# Patient Record
Sex: Female | Born: 1960
Health system: Southern US, Community
[De-identification: ages and names within clinical notes are randomized; demographics above are authoritative.]

## PROBLEM LIST (undated history)

## (undated) DIAGNOSIS — J4530 Mild persistent asthma, uncomplicated: Secondary | ICD-10-CM

## (undated) DIAGNOSIS — K219 Gastro-esophageal reflux disease without esophagitis: Secondary | ICD-10-CM

## (undated) DIAGNOSIS — N393 Stress incontinence (female) (male): Secondary | ICD-10-CM

## (undated) DIAGNOSIS — E611 Iron deficiency: Secondary | ICD-10-CM

## (undated) DIAGNOSIS — Z973 Presence of spectacles and contact lenses: Secondary | ICD-10-CM

## (undated) DIAGNOSIS — N3010 Interstitial cystitis (chronic) without hematuria: Secondary | ICD-10-CM

## (undated) DIAGNOSIS — M1990 Unspecified osteoarthritis, unspecified site: Secondary | ICD-10-CM

## (undated) HISTORY — PX: CYSTOSCOPY: SHX5120

## (undated) HISTORY — DX: Interstitial cystitis (chronic) without hematuria: N30.10

## (undated) HISTORY — PX: CYSTOSCOPY WITH HYDRODISTENSION AND BIOPSY: SHX5127

## (undated) HISTORY — PX: BLADDER SURGERY: SHX569

## (undated) HISTORY — PX: OTHER SURGICAL HISTORY: SHX169

---

## 1982-04-09 HISTORY — PX: POSTERIOR FUSION CERVICAL SPINE: SUR628

## 1998-04-19 ENCOUNTER — Other Ambulatory Visit: Admission: RE | Admit: 1998-04-19 | Discharge: 1998-04-19 | Payer: Self-pay | Admitting: Obstetrics & Gynecology

## 1998-10-07 ENCOUNTER — Ambulatory Visit (HOSPITAL_COMMUNITY): Admission: RE | Admit: 1998-10-07 | Discharge: 1998-10-07 | Payer: Self-pay | Admitting: Obstetrics & Gynecology

## 1999-09-05 ENCOUNTER — Other Ambulatory Visit: Admission: RE | Admit: 1999-09-05 | Discharge: 1999-09-05 | Payer: Self-pay | Admitting: Obstetrics & Gynecology

## 2000-08-28 ENCOUNTER — Other Ambulatory Visit: Admission: RE | Admit: 2000-08-28 | Discharge: 2000-08-28 | Payer: Self-pay | Admitting: Obstetrics & Gynecology

## 2001-08-29 ENCOUNTER — Other Ambulatory Visit: Admission: RE | Admit: 2001-08-29 | Discharge: 2001-08-29 | Payer: Self-pay | Admitting: Obstetrics & Gynecology

## 2003-01-18 ENCOUNTER — Encounter: Payer: Self-pay | Admitting: Emergency Medicine

## 2003-01-18 ENCOUNTER — Emergency Department (HOSPITAL_COMMUNITY): Admission: EM | Admit: 2003-01-18 | Discharge: 2003-01-18 | Payer: Self-pay | Admitting: Emergency Medicine

## 2003-01-25 ENCOUNTER — Other Ambulatory Visit: Admission: RE | Admit: 2003-01-25 | Discharge: 2003-01-25 | Payer: Self-pay | Admitting: Obstetrics & Gynecology

## 2004-07-27 ENCOUNTER — Other Ambulatory Visit: Admission: RE | Admit: 2004-07-27 | Discharge: 2004-07-27 | Payer: Self-pay | Admitting: Obstetrics & Gynecology

## 2009-01-03 ENCOUNTER — Encounter: Admission: RE | Admit: 2009-01-03 | Discharge: 2009-01-03 | Payer: Self-pay | Admitting: Obstetrics & Gynecology

## 2010-05-01 ENCOUNTER — Encounter: Payer: Self-pay | Admitting: Obstetrics & Gynecology

## 2010-06-09 IMAGING — MG MM DIAGNOSTIC UNILATERAL L
2 series · 2 of 2 positions shown · non-contrast
Comparison: 06/27/2007 and 03/15/2006

CLINICAL DATA: Patient presents for additional views of the left
breast as a follow up to recent screening mammogram from 12/27/2008
demonstrating possible mass in the deep third of the mid to lower
left breast.

DIGITAL DIAGNOSTIC LEFT MAMMOGRAM

[L MLO]
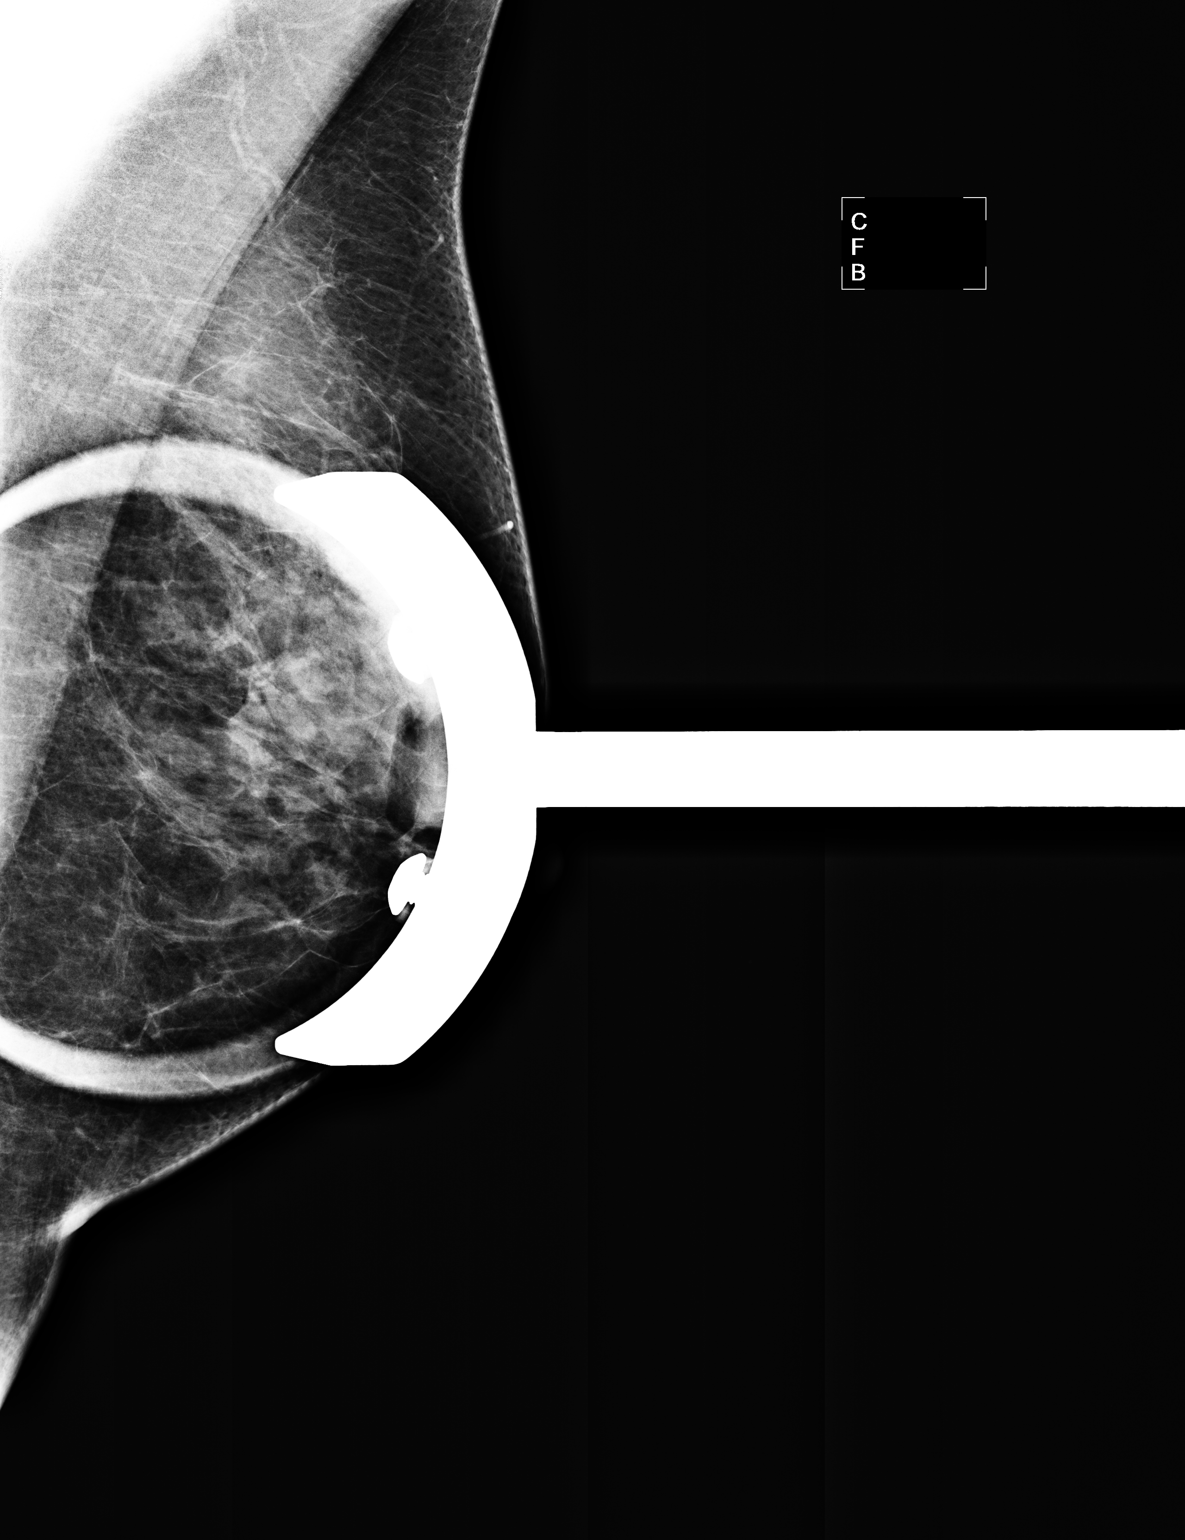

[L ML]
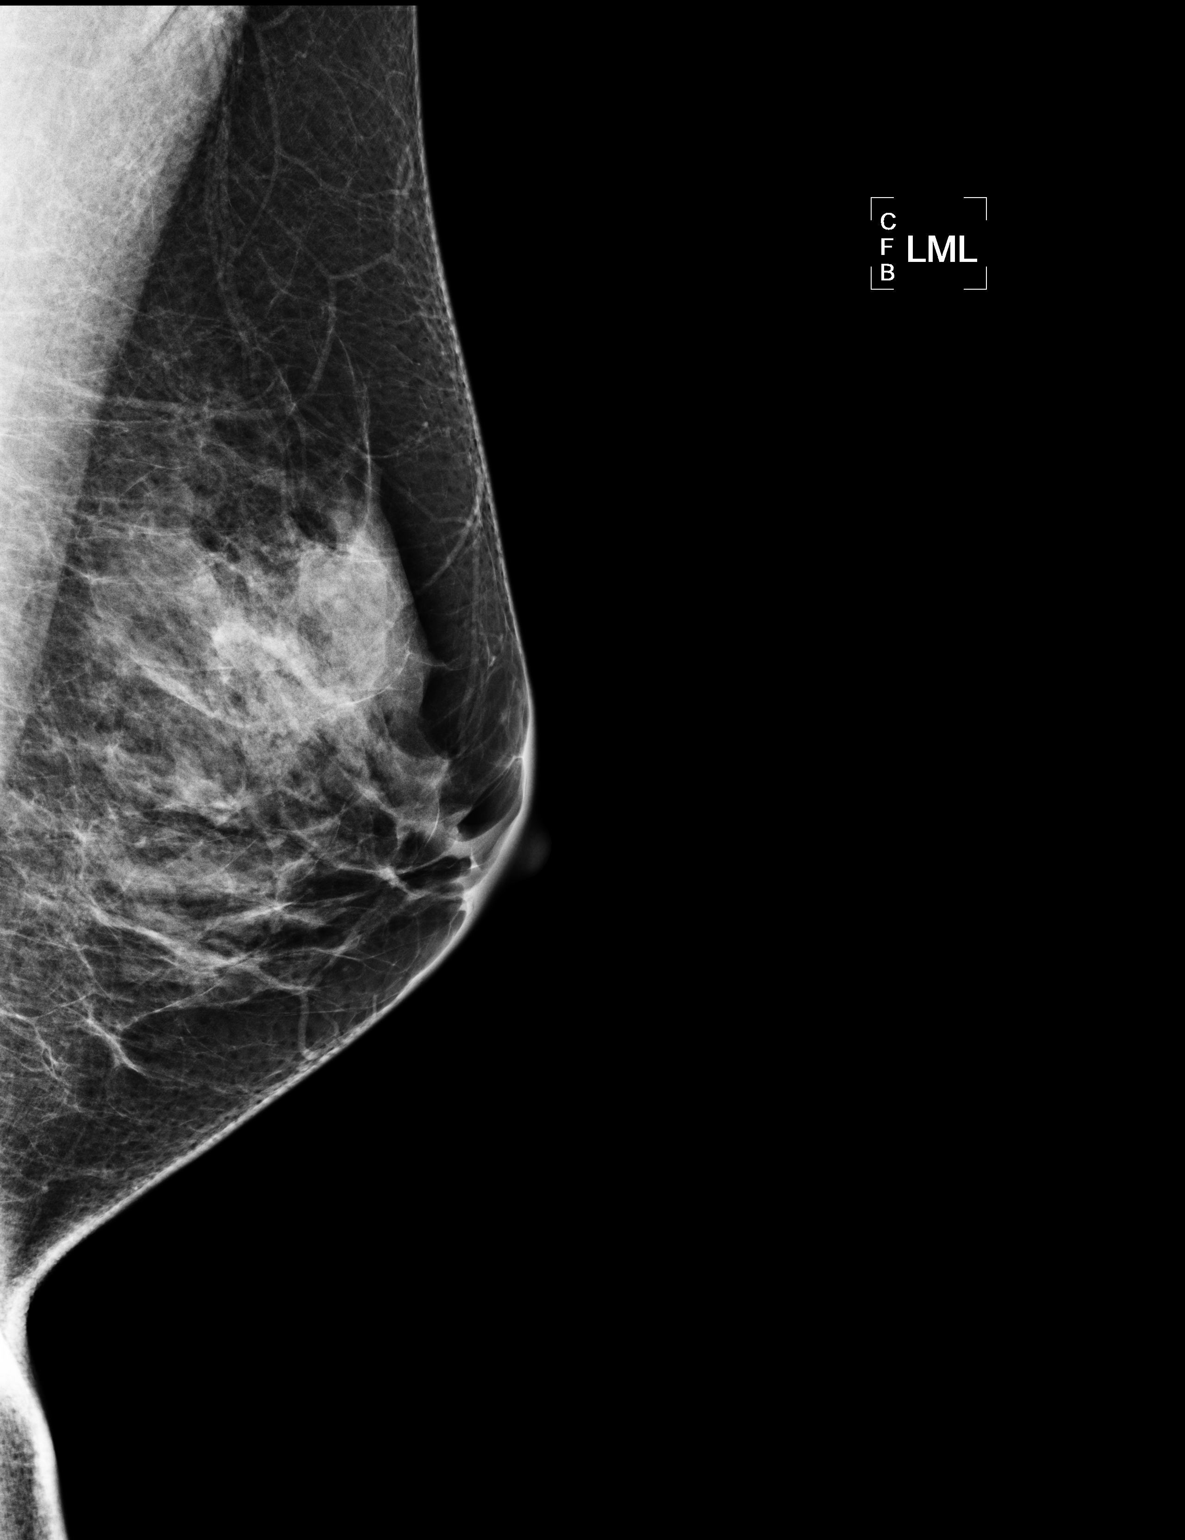

[2 of 2 positions shown; findings below may reference images not displayed]

FINDINGS: Spot compression MLO and true lateral images fail to
demonstrate a focal abnormality in the deep third of the mid to
lower left breast.
IMPRESSION: No focal abnormality over the deep third of the left mid to lower
breast.

Recommendations:  Recommend continued annual bilateral screening
mammographic followup.

BI-RADS CATEGORY 1:  Negative.

## 2010-06-21 ENCOUNTER — Institutional Professional Consult (permissible substitution) (INDEPENDENT_AMBULATORY_CARE_PROVIDER_SITE_OTHER): Payer: BC Managed Care – PPO | Admitting: Internal Medicine

## 2010-06-21 ENCOUNTER — Ambulatory Visit (INDEPENDENT_AMBULATORY_CARE_PROVIDER_SITE_OTHER)
Admission: RE | Admit: 2010-06-21 | Discharge: 2010-06-21 | Disposition: A | Discharge status: Home / Self Care | Payer: BC Managed Care – PPO | Source: Ambulatory Visit | Attending: Internal Medicine | Admitting: Internal Medicine

## 2010-06-21 ENCOUNTER — Other Ambulatory Visit: Payer: Self-pay | Admitting: Internal Medicine

## 2010-06-21 ENCOUNTER — Encounter: Payer: Self-pay | Admitting: Internal Medicine

## 2010-06-21 DIAGNOSIS — J45909 Unspecified asthma, uncomplicated: Secondary | ICD-10-CM

## 2010-06-21 DIAGNOSIS — J309 Allergic rhinitis, unspecified: Secondary | ICD-10-CM

## 2010-06-22 ENCOUNTER — Encounter: Payer: Self-pay | Admitting: Internal Medicine

## 2010-06-27 NOTE — Assessment & Plan Note (Signed)
Summary: asthma- self refer- pt saw dr Kamil Mchaffie previously for same ///kp   Primary Provider/Referring Provider:  Donette Larry  CC:  Asthma consult-former pt at [facility] chest-using Advair more than usual..  History of Present Illness: June 21, 2010- 50 yoF followed by Dr Donette Larry for primary care and self referred now, asking  pulmonary status update. I had seen her in 1990 and 1997 for asthma and allergic rhinitis. FEV1 then was around 81-88% and a methacholine challenge was positive at 50 units. Asthma onset in early 30's. Smoked 1 PPD for 12 years ending age 17. In last 1-2 years, seasonal rhinits more perennial, less well defined and asthma has followed same pattern. Fall usually worse than Spring. Triggers- Fall season, close exposure to her indoor dog, colds with sinus drainage, irritant odors and smoke. Helps if she wears mask to dust or rake leaves. Usually some morning cough with clear mucus. Denies reflux and breathing doesn't wake her.  House, no basement or mold, carpet, 1 dog, no smokers.  Uses Advair few days at a time for acute flares- usually sufficient. Occasional Ventolin rescue during flares and before exercise. Last prednisone several years ago, no ER or Hosp for breathing.  Has not wanted pneumovax or flu vax  Asthma History    Initial Asthma Severity Rating:    Age range: 12+ years    Symptoms: 0-2 days/week    Nighttime Awakenings: 0-2/month    Interferes w/ normal activity: no limitations    SABA use (not for EIB): 0-2 days/week    Asthma Severity Assessment: Intermittent   Preventive Screening-Counseling & Management  Alcohol-Tobacco     Smoking Status: quit     Packs/Day: 1.0     Year Started: age 20     Year Quit: 03-09-1986  Current Medications (verified): 1)  Advair Diskus 100-50 Mcg/dose Aepb (Fluticasone-Salmeterol) .... Take 1 Two Times A Day As Needed 2)  Ferrous Sulfate 325 (65 Fe) Mg Tabs (Ferrous Sulfate) .... Take 1 By Mouth Once Daily  Allergies  (verified): 1)  ! Macrobid  Past History:  Past Medical History: Asthma Allergic rhinitis Hx interstital cystitis  Past Surgical History: C-spine fusion for neck fx cystoscopy  Family History: Family hx: breast cancer, allergies, heart disease. 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  The patient denies shortness of breath at rest, productive cough, coughing up blood, chest pain, irregular heartbeats, acid heartburn, indigestion, loss of appetite, weight change, abdominal pain, difficulty swallowing, sore throat, tooth/dental problems, headaches, sneezing, itching, ear ache, anxiety, depression, hand/feet swelling, joint stiffness or pain, rash, change in color of mucus, and fever.    Vital Signs:  Patient profile:   50 year old female Height:      64 inches Weight:      149.25 pounds BMI:     25.71 O2 Sat:      99 % on Room air Pulse rate:   55 / minute BP sitting:   110 / 72  (left arm) Cuff size:   regular  Vitals Entered By: Reynaldo Minium CMA (June 21, 2010 9:12 AM)  O2 Flow:  Room air CC: Asthma consult-former pt at [facility] chest-using Advair more than usual.  Physical Exam  Additional Exam:  General: A/Ox3; pleasant and cooperative, NAD, wdwn SKIN: no rash, lesions NODES: no lymphadenopathy HEENT: [place]/AT, EOM- WNL, Conjuctivae- clear, PERRLA, TM-WNL, Nose- clear, Throat- clear and wnl, Mallampati  II NECK: Supple w/ fair ROM, JVD- none, normal carotid impulses w/o bruits Thyroid- normal to palpation CHEST: Clear to P&A, no cough or wheeze HEART: RRR, no m/g/r heard ABDOMEN: Soft and nl; nml bowel sounds; no organomegaly or masses  noted ZOX:WRUE, nl pulses, no edema  NEURO: Grossly intact to observation      Impression & Recommendations:  Problem # 1:  ASTHMA, MILD (ICD-493.90) Mild intermittent and in past mostly a seasonal pattern. Maybe a little more persistent in past few years. We discussed her meds and will increase her Advair to try maintenance once daily for next month, with rescue if needed or before exercise.  Will get PFT and CXR. She describes a morning cough which not progressive r intrusive. it suggests a chronic bronchitis pattern.   Problem # 2:  ALLERGIC RHINITIS (ICD-477.9) Allergy Skin Testing 1995- Pos for some weed, tree, mold and cat. mild, seasonal and managed ok with otc antihistamines.  . .   Medications Added to Medication List This Visit: 1)  Advair Diskus 100-50 Mcg/dose Aepb (Fluticasone-salmeterol) .... Take 1 two times a day as needed 2)  Ferrous Sulfate 325 (65 Fe) Mg Tabs (Ferrous sulfate) .... Take 1 by mouth once daily  Other Orders: New Patient Level IV (45409) T-2 View CXR (71020TC)  Patient Instructions: 1)  Please schedule a follow-up appointment in 1 month. Please call sooner as needed.  2)  A chest x-ray has been recommended.  Your imaging study may require preauthorization.  3)  Schedule PFT 4)  a1AT test 5)  I suggest that for the next month, you try using your maintenance Advair once every day, then rinse mouth. 6)  Continue with Ventolin rescue inhaler as you have been.

## 2010-06-27 NOTE — Letter (Signed)
Summary: [place] [facility] Results Follow Up Letter  [facility]  [address]   [place], [place] [address]   Phone: [phone]  Fax: [phone]    06/22/2010 MRN: [id number]  Cindy Decker [address] [place], [place]  [address]  [place]  Dear Ms. SAMPSEL,  We have received the results from your recent tests and have been unable to contact you.  Please call our office at [phone] so that Dr.Young or his nurse may review the results with you.    Thank you,     Nature conservation officer Pulmonary Division

## 2010-06-28 ENCOUNTER — Telehealth: Payer: Self-pay | Admitting: Internal Medicine

## 2010-06-28 NOTE — Telephone Encounter (Signed)
Katie mailed letter to pt because she attempted to call her regarding CXR results however home # was a fax #.    Called, spoke with pt.  She was informed per append in EMR from 06/21/10 by Dr. Maple Hudson: CXR- There is some scarring in the upper parts of both lungs, but no acute findings. We will discuss on return. Pt verbalized understanding of this.  Pt also states her home number is the same as her fax number.  Best number to reach her on is cell:  [phone].  Number added to pt's demographics.

## 2010-07-12 ENCOUNTER — Encounter: Payer: Self-pay | Admitting: Internal Medicine

## 2010-07-17 NOTE — Progress Notes (Signed)
Quick Note:  LMTCB ______ 

## 2010-08-09 ENCOUNTER — Encounter: Payer: Self-pay | Admitting: Internal Medicine

## 2010-08-11 ENCOUNTER — Ambulatory Visit (INDEPENDENT_AMBULATORY_CARE_PROVIDER_SITE_OTHER): Payer: BC Managed Care – PPO | Admitting: Internal Medicine

## 2010-08-11 ENCOUNTER — Encounter: Payer: Self-pay | Admitting: Internal Medicine

## 2010-08-11 VITALS — BP 112/58 | HR 51 | Ht 64.0 in | Wt 145.0 lb

## 2010-08-11 DIAGNOSIS — J45909 Unspecified asthma, uncomplicated: Secondary | ICD-10-CM

## 2010-08-11 LAB — PULMONARY FUNCTION TEST

## 2010-08-11 MED ORDER — FLUTICASONE-SALMETEROL 100-50 MCG/DOSE IN AEPB: 1.0000 | INHALATION_SPRAY | Freq: Two times a day (BID) | RESPIRATORY_TRACT | Status: DC

## 2010-08-11 MED ORDER — ALBUTEROL SULFATE HFA 108 (90 BASE) MCG/ACT IN AERS: 2.0000 | INHALATION_SPRAY | Freq: Four times a day (QID) | RESPIRATORY_TRACT | Status: DC | PRN

## 2010-08-11 NOTE — Progress Notes (Signed)
  Subjective:    Patient ID: Cindy Decker, female    DOB: 12-01-1960, 50 y.o.   MRN: [id number]  HPI 08/11/10- 50 yoF former smoker followed for chronic obstructive asthma/ minimal COPD, allergic rhinitis.  Last here March 14 for initial visit. No significant changes since that visit- note reviewed.  She has taken some antihistamine but pollen season hasn't been bad. PFT- 08/10/09- FEV1 2.29/93%, FEV1/FVC 0.66, FEF 25-75 1.38/ 49% w/ response to BD  DLCO 98% a1AT- M/S functionally normal variant CXR - 06/21/10- apical scarring, NAD.   Review of Systems Constitutional:   No weight loss, night sweats,  Fevers, chills, fatigue, lassitude. HEENT:   No headaches,  Difficulty swallowing,  Tooth/dental problems,  Sore throat,                No sneezing, itching, ear ache, nasal congestion, post nasal drip,   CV:  No chest pain,  Orthopnea, PND, swelling in lower extremities, anasarca, dizziness, palpitations  GI  No heartburn, indigestion, abdominal pain, nausea, vomiting, diarrhea, change in bowel habits, loss of appetite  Resp: No shortness of breath with exertion or at rest.  No excess mucus, no productive cough,  No non-productive cough,  No coughing up of blood.  No change in color of mucus.  No wheezing.   Skin: no rash or lesions.  GU: no dysuria, change in color of urine, no urgency or frequency.  No flank pain.  MS:  No joint pain or swelling.  No decreased range of motion.  No back pain.  Psych:  No change in mood or affect. No depression or anxiety.  No memory loss.       Objective:   Physical Exam    General- Alert, Oriented, Affect-appropriate, Distress- none acute  Skin- rash-none, lesions- none, excoriation- none  Lymphadenopathy- none  Head- atraumatic  Eyes- Gross vision intact, PERRLA, conjunctivae clear, secretions  Ears- Hearing, canals, Tm L ,   R ,-  normal  Nose- Clear, No-Septal dev, mucus, polyps, erosion, perforation   Throat- Mallampati II , mucosa  clear , drainage- none, tonsils- atrophic  Neck- flexible , trachea midline, no stridor , thyroid nl, carotid no bruit  Chest - symmetrical excursion , unlabored     Heart/CV- RRR , no murmur , no gallop  , no rub, nl s1 s2                     - JVD- none , edema- none, stasis changes- none, varices- none     Lung- clear to P&A, wheeze- none, cough- none , dullness-none, rub- none     Chest wall-  Abd- tender-no, distended-no, bowel sounds-present, HSM- no  Br/ Gen/ Rectal- Not done, not indicated  Extrem- cyanosis- none, clubbing, none, atrophy- none, strength- nl  Neuro- grossly intact to observation      Assessment & Plan:

## 2010-08-11 NOTE — Patient Instructions (Addendum)
Continue Advair 100/50, 1 puff once daily. If trouble starts, move up to twice daily.  Refill scripts for Advair and Ventolin printed  Your alpha 1 antitrypsin genotype' is M-S  ,which is a benign variant.

## 2010-08-11 NOTE — Progress Notes (Signed)
PFT done today. 

## 2010-08-11 NOTE — Assessment & Plan Note (Signed)
Mild chronic obstructive asthma. Will continue Advair once daily aeith discusion. Encouraged aerobic exercise. Her M-S variant alpha does not carry increased risk.

## 2010-08-20 ENCOUNTER — Encounter: Payer: Self-pay | Admitting: Internal Medicine

## 2010-08-29 ENCOUNTER — Encounter: Payer: Self-pay | Admitting: Internal Medicine

## 2015-08-18 DIAGNOSIS — G5761 Lesion of plantar nerve, right lower limb: Secondary | ICD-10-CM | POA: Diagnosis not present

## 2015-09-06 DIAGNOSIS — H40013 Open angle with borderline findings, low risk, bilateral: Secondary | ICD-10-CM | POA: Diagnosis not present

## 2015-09-13 DIAGNOSIS — H40033 Anatomical narrow angle, bilateral: Secondary | ICD-10-CM | POA: Diagnosis not present

## 2015-09-13 DIAGNOSIS — H2513 Age-related nuclear cataract, bilateral: Secondary | ICD-10-CM | POA: Diagnosis not present

## 2015-09-13 DIAGNOSIS — H43812 Vitreous degeneration, left eye: Secondary | ICD-10-CM | POA: Diagnosis not present

## 2015-10-10 DIAGNOSIS — H25013 Cortical age-related cataract, bilateral: Secondary | ICD-10-CM | POA: Diagnosis not present

## 2015-10-10 DIAGNOSIS — H40033 Anatomical narrow angle, bilateral: Secondary | ICD-10-CM | POA: Diagnosis not present

## 2015-10-10 DIAGNOSIS — H04123 Dry eye syndrome of bilateral lacrimal glands: Secondary | ICD-10-CM | POA: Diagnosis not present

## 2015-10-10 DIAGNOSIS — H2513 Age-related nuclear cataract, bilateral: Secondary | ICD-10-CM | POA: Diagnosis not present

## 2015-12-19 DIAGNOSIS — H40033 Anatomical narrow angle, bilateral: Secondary | ICD-10-CM | POA: Diagnosis not present

## 2015-12-19 DIAGNOSIS — H43812 Vitreous degeneration, left eye: Secondary | ICD-10-CM | POA: Diagnosis not present

## 2015-12-19 DIAGNOSIS — H2513 Age-related nuclear cataract, bilateral: Secondary | ICD-10-CM | POA: Diagnosis not present

## 2016-01-02 DIAGNOSIS — Z0000 Encounter for general adult medical examination without abnormal findings: Secondary | ICD-10-CM | POA: Diagnosis not present

## 2016-01-02 DIAGNOSIS — J309 Allergic rhinitis, unspecified: Secondary | ICD-10-CM | POA: Diagnosis not present

## 2016-01-02 DIAGNOSIS — Z1159 Encounter for screening for other viral diseases: Secondary | ICD-10-CM | POA: Diagnosis not present

## 2016-03-27 DIAGNOSIS — M722 Plantar fascial fibromatosis: Secondary | ICD-10-CM | POA: Diagnosis not present

## 2016-05-31 DIAGNOSIS — M722 Plantar fascial fibromatosis: Secondary | ICD-10-CM | POA: Diagnosis not present

## 2016-06-18 DIAGNOSIS — M722 Plantar fascial fibromatosis: Secondary | ICD-10-CM | POA: Diagnosis not present

## 2016-07-02 DIAGNOSIS — Z6826 Body mass index (BMI) 26.0-26.9, adult: Secondary | ICD-10-CM | POA: Diagnosis not present

## 2016-07-02 DIAGNOSIS — Z01419 Encounter for gynecological examination (general) (routine) without abnormal findings: Secondary | ICD-10-CM | POA: Diagnosis not present

## 2016-07-02 DIAGNOSIS — Z1231 Encounter for screening mammogram for malignant neoplasm of breast: Secondary | ICD-10-CM | POA: Diagnosis not present

## 2016-07-13 DIAGNOSIS — J45909 Unspecified asthma, uncomplicated: Secondary | ICD-10-CM | POA: Diagnosis not present

## 2016-07-13 DIAGNOSIS — R05 Cough: Secondary | ICD-10-CM | POA: Diagnosis not present

## 2016-07-13 DIAGNOSIS — J0100 Acute maxillary sinusitis, unspecified: Secondary | ICD-10-CM | POA: Diagnosis not present

## 2016-08-24 DIAGNOSIS — J309 Allergic rhinitis, unspecified: Secondary | ICD-10-CM | POA: Diagnosis not present

## 2016-08-24 DIAGNOSIS — R05 Cough: Secondary | ICD-10-CM | POA: Diagnosis not present

## 2016-08-24 DIAGNOSIS — J45909 Unspecified asthma, uncomplicated: Secondary | ICD-10-CM | POA: Diagnosis not present

## 2017-01-07 DIAGNOSIS — Z1389 Encounter for screening for other disorder: Secondary | ICD-10-CM | POA: Diagnosis not present

## 2017-01-07 DIAGNOSIS — Z136 Encounter for screening for cardiovascular disorders: Secondary | ICD-10-CM | POA: Diagnosis not present

## 2017-01-07 DIAGNOSIS — Z23 Encounter for immunization: Secondary | ICD-10-CM | POA: Diagnosis not present

## 2017-01-07 DIAGNOSIS — Z0000 Encounter for general adult medical examination without abnormal findings: Secondary | ICD-10-CM | POA: Diagnosis not present

## 2017-03-04 DIAGNOSIS — M810 Age-related osteoporosis without current pathological fracture: Secondary | ICD-10-CM | POA: Diagnosis not present

## 2017-03-04 DIAGNOSIS — Z780 Asymptomatic menopausal state: Secondary | ICD-10-CM | POA: Diagnosis not present

## 2017-03-11 DIAGNOSIS — J45909 Unspecified asthma, uncomplicated: Secondary | ICD-10-CM | POA: Diagnosis not present

## 2017-03-11 DIAGNOSIS — M810 Age-related osteoporosis without current pathological fracture: Secondary | ICD-10-CM | POA: Diagnosis not present

## 2017-03-11 DIAGNOSIS — E559 Vitamin D deficiency, unspecified: Secondary | ICD-10-CM | POA: Diagnosis not present

## 2017-07-08 DIAGNOSIS — Z01419 Encounter for gynecological examination (general) (routine) without abnormal findings: Secondary | ICD-10-CM | POA: Diagnosis not present

## 2017-07-08 DIAGNOSIS — Z124 Encounter for screening for malignant neoplasm of cervix: Secondary | ICD-10-CM | POA: Diagnosis not present

## 2017-07-08 DIAGNOSIS — Z1231 Encounter for screening mammogram for malignant neoplasm of breast: Secondary | ICD-10-CM | POA: Diagnosis not present

## 2017-07-08 DIAGNOSIS — Z6826 Body mass index (BMI) 26.0-26.9, adult: Secondary | ICD-10-CM | POA: Diagnosis not present

## 2017-12-19 DIAGNOSIS — M25572 Pain in left ankle and joints of left foot: Secondary | ICD-10-CM | POA: Diagnosis not present

## 2018-01-09 DIAGNOSIS — M79675 Pain in left toe(s): Secondary | ICD-10-CM | POA: Diagnosis not present

## 2018-01-13 DIAGNOSIS — Z0000 Encounter for general adult medical examination without abnormal findings: Secondary | ICD-10-CM | POA: Diagnosis not present

## 2018-01-13 DIAGNOSIS — Z13220 Encounter for screening for lipoid disorders: Secondary | ICD-10-CM | POA: Diagnosis not present

## 2018-01-13 DIAGNOSIS — Z23 Encounter for immunization: Secondary | ICD-10-CM | POA: Diagnosis not present

## 2018-01-13 DIAGNOSIS — Z1389 Encounter for screening for other disorder: Secondary | ICD-10-CM | POA: Diagnosis not present

## 2018-01-28 DIAGNOSIS — C44519 Basal cell carcinoma of skin of other part of trunk: Secondary | ICD-10-CM | POA: Diagnosis not present

## 2018-01-28 DIAGNOSIS — L570 Actinic keratosis: Secondary | ICD-10-CM | POA: Diagnosis not present

## 2018-01-28 DIAGNOSIS — D225 Melanocytic nevi of trunk: Secondary | ICD-10-CM | POA: Diagnosis not present

## 2018-01-28 DIAGNOSIS — X32XXXA Exposure to sunlight, initial encounter: Secondary | ICD-10-CM | POA: Diagnosis not present

## 2018-02-05 ENCOUNTER — Ambulatory Visit: Payer: BLUE CROSS/BLUE SHIELD | Admitting: Internal Medicine

## 2018-02-05 ENCOUNTER — Encounter: Payer: Self-pay | Admitting: Internal Medicine

## 2018-02-05 VITALS — BP 116/70 | HR 69 | Ht 64.0 in | Wt 146.8 lb

## 2018-02-05 DIAGNOSIS — Z87891 Personal history of nicotine dependence: Secondary | ICD-10-CM | POA: Diagnosis not present

## 2018-02-05 DIAGNOSIS — J45909 Unspecified asthma, uncomplicated: Secondary | ICD-10-CM

## 2018-02-05 LAB — NITRIC OXIDE: Nitric Oxide: 10

## 2018-02-05 NOTE — Progress Notes (Signed)
08/11/10- 72 yoF former smoker followed for chronic obstructive asthma/ minimal COPD, allergic rhinitis.  Last here March 14 for initial visit. No significant changes since that visit- note reviewed.  She has taken some antihistamine but pollen season hasn't been bad. PFT- 08/10/09- FEV1 2.29/93%, FEV1/FVC 0.66, FEF 25-75 1.38/ 49% w/ response to BD  DLCO 98% a1AT- M/S functionally normal variant CXR - 06/21/10- apical scarring, NAD.    OV 02/05/2018  Subjective:  Patient ID: Cindy Decker, female , DOB: 11-23-1960 , age 57 y.o. , MRN: [id number] , ADDRESS: [address] [place] [address]   02/05/2018 -   Chief Complaint  Patient presents with  . Consult    seen back in 2012 by CY for asthma, would like more opinions      HPI Cindy Decker 57 y.o. -is a new consult.  Last seen by Dr. Jetty Duhamel in 2012.  Because it is been more than 3 years there is a new evaluation.  Review of the notes from 2012 indicate MS alpha-1 variant.  Asthma was considered mild persistent with above pulmonary function test.  She was then on Advair.  She tells me she has had an asthma diagnosis since age 39 which was 29 years ago.  Since then it has been stable.  For at least 10 or 12 years she is been on Advair mostly taking it intermittently but for the last 2 years she takes it scale on a scheduled basis.  If she does not take her Advair her symptoms do get worse but only by a mild degree.  When she is on Advair she feels better but she continues to have persistent symptoms again at a mild level.  In particular her symptoms are that of cough at a level 3 which is the most significant of symptoms.  And shortness of breath, chest tightness and clearing of the throat at level 2.  Early morning she does bring some clear sputum again rated at level 2 out of 5.  She barely has any wheezing and rated as a level 0-1.  Definitely no orthopnea proximal nocturnal dyspnea.  The cough is particularly worse when she  begins to play racquetball but later as she warms up and starts playing the cough does get better.  For racquetball she has no shortness of breath.  In doing the asthma control questionnaire she says she does not wake up at night because of asthma symptoms when she wakes up she has moderate amount of symptoms and she feels very slightly limited because of asthma and a little shortness of breath and a little of the time she is wheezing but does not use albuterol for rescue.  Her exam nitric oxide today  - is 10 ppb and normal and this is on Advair.  Of note, she does have allergies to dogs and the fall season.  He recollects allergy testing in the 1990s which was positive.  She took allergy shots for a year back then and did not help.  Her mom has COPD  Her biggest concern is that she wants to improve the quality of her cough and the quantity of a cough.  This is because she is been diagnosed with osteoporosis and she is worried about rib fractures.  She is somewhat cost conscious.  Because of a high deductible health plan.     2012 PFT image reviewed and visulaized  Chest x-ray March 2012: Personally visualized the image.  Appears to  have hyperinflation. That  was the last chest x-ray in our health system. ROS - per HPI     has a past medical history of ALLERGIC RHINITIS, Asthma, and Interstitial cystitis.   reports that she quit smoking about 31 years ago. She has never used smokeless tobacco.  Past Surgical History:  Procedure Laterality Date  . c-spine fusion for neck fx    . CYSTOSCOPY      Allergies  Allergen Reactions  . Nitrofurantoin      There is no immunization history on file for this patient.  Family History  Problem Relation Age of Onset  . Breast cancer Other   . Allergies Other   . Heart disease Other   . COPD Mother      Current Outpatient Medications:  .  albuterol (VENTOLIN HFA) 108 (90 BASE) MCG/ACT inhaler, Inhale 2 puffs into the lungs every 6 (six) hours  as needed for wheezing or shortness of breath., Disp: 1 Inhaler, Rfl: prn .  cetirizine (ZYRTEC) 10 MG tablet, Take 10 mg by mouth daily., Disp: , Rfl:  .  cholecalciferol (VITAMIN D) 1000 units tablet, Take 1,000 Units by mouth daily., Disp: , Rfl:  .  ferrous sulfate 325 (65 FE) MG tablet, Take 325 mg by mouth daily with breakfast.  , Disp: , Rfl:  .  Fluticasone-Salmeterol (ADVAIR DISKUS) 100-50 MCG/DOSE AEPB, Inhale 1 puff into the lungs every 12 (twelve) hours. Rinse mouth, Disp: 180 each, Rfl: 3      Objective:   Vitals:   02/05/18 1120  BP: 116/70  Pulse: 69  SpO2: 99%  Weight: 146 lb 12.8 oz (66.6 kg)  Height: 5\' 4"  (1.626 m)    Estimated body mass index is 25.2 kg/m as calculated from the following:   Height as of this encounter: 5\' 4"  (1.626 m).   Weight as of this encounter: 146 lb 12.8 oz (66.6 kg).  @WEIGHTCHANGE @  [employer]   02/05/18 1120  Weight: 146 lb 12.8 oz (66.6 kg)     Physical Exam  General Appearance:    Alert, cooperative, no distress, appears stated age - yes , Deconditioned looking - no , OBESE  - no, Sitting on Wheelchair -  no  Head:    Normocephalic, without obvious abnormality, atraumatic  Eyes:    PERRL, conjunctiva/corneas clear,  Ears:    Normal TM's and external ear canals, both ears  Nose:   Nares normal, septum midline, mucosa normal, no drainage    or sinus tenderness. OXYGEN ON  - no . Patient is @ ra   Throat:   Lips, mucosa, and tongue normal; teeth and gums normal. Cyanosis on lips - no  Neck:   Supple, symmetrical, trachea midline, no adenopathy;    thyroid:  no enlargement/tenderness/nodules; no carotid   bruit or JVD  Back:     Symmetric, no curvature, ROM normal, no CVA tenderness  Lungs:     Distress - no , Wheeze no, Barrell Chest - no, Purse lip breathing - no, Crackles - no   Chest Wall:    No tenderness or deformity.    Heart:    Regular rate and rhythm, S1 and S2 normal, no rub   or gallop, Murmur - no  Breast  Exam:    NOT DONE  Abdomen:     Soft, non-tender, bowel sounds active all four quadrants,    no masses, no organomegaly. Visceral obesity - no  Genitalia:   NOT DONE  Rectal:  NOT DONE  Extremities:   Extremities - normal, Has Cane - no, Clubbing - o, Edema - no  Pulses:   2+ and symmetric all extremities  Skin:   Stigmata of Connective Tissue Disease - no  Lymph nodes:   Cervical, supraclavicular, and axillary nodes normal  Psychiatric:  Neurologic:   Pleasant - yes, Anxious - no, Flat affect - no  CAm-ICU - neg, Alert and Oriented x 3 - yes, Moves all 4s - yes, Speech - normal, Cognition - intact           Assessment:       ICD-10-CM   1. Asthma, unspecified asthma severity, unspecified whether complicated, unspecified whether persistent J45.909 Nitric oxide    Pulmonary Function Test  2. History of prior cigarette smoking Z87.891        Plan:     Patient Instructions     ICD-10-CM   1. Asthma, unspecified asthma severity, unspecified whether complicated, unspecified whether persistent J45.909 Nitric oxide  2. History of prior cigarette smoking Z87.891     Some level of excess symptoms than what we should expect  Plan  - ideally do HRCT, PFT, and +/- blood allergy profile. However, share decision on goals of testing and discussion about a simplistic algorithm - please do full PFT and return for followup  - based on PFT testing can consider adding spiriva or changing advair to something less throat irritating like dulera or symbicort    - flu shot if you are interested  In it 02/05/2018   Followup  - return to see me or APP after PFT; next few to several weeks      SIGNATURE    Dr. Kalman Shan, M.D., F.C.C.P,  Pulmonary and Critical Care Medicine Staff Physician, [facility] Center Director - Interstitial Lung Disease  Program  Pulmonary Fibrosis [facility] at [facility] [place], [place], [address]  Pager: [phone], If no answer or between  15:00h - 7:00h: call [phone] Telephone: [phone]  12:01 PM 02/05/2018

## 2018-02-05 NOTE — Patient Instructions (Addendum)
ICD-10-CM   1. Asthma, unspecified asthma severity, unspecified whether complicated, unspecified whether persistent J45.909 Nitric oxide  2. History of prior cigarette smoking Z87.891     Some level of excess symptoms than what we should expect  Plan  - ideally do HRCT, PFT, and +/- blood allergy profile. However, share decision on goals of testing and discussion about a simplistic algorithm - please do full PFT and return for followup  - based on PFT testing can consider adding spiriva or changing advair to something less throat irritating like dulera or symbicort    - flu shot if you are interested  In it 02/05/2018   Followup  - return to see me or APP after PFT; next few to several weeks

## 2018-02-19 DIAGNOSIS — M79672 Pain in left foot: Secondary | ICD-10-CM | POA: Diagnosis not present

## 2018-02-19 DIAGNOSIS — M7752 Other enthesopathy of left foot: Secondary | ICD-10-CM | POA: Diagnosis not present

## 2018-03-11 DIAGNOSIS — C44519 Basal cell carcinoma of skin of other part of trunk: Secondary | ICD-10-CM | POA: Diagnosis not present

## 2018-03-19 ENCOUNTER — Ambulatory Visit: Payer: BLUE CROSS/BLUE SHIELD | Admitting: Internal Medicine

## 2018-03-19 ENCOUNTER — Ambulatory Visit (INDEPENDENT_AMBULATORY_CARE_PROVIDER_SITE_OTHER): Payer: BLUE CROSS/BLUE SHIELD | Admitting: Internal Medicine

## 2018-03-19 ENCOUNTER — Encounter: Payer: Self-pay | Admitting: Internal Medicine

## 2018-03-19 ENCOUNTER — Telehealth: Payer: Self-pay | Admitting: Internal Medicine

## 2018-03-19 VITALS — BP 110/80 | HR 82 | Ht 64.0 in | Wt 147.0 lb

## 2018-03-19 DIAGNOSIS — J45909 Unspecified asthma, uncomplicated: Secondary | ICD-10-CM | POA: Diagnosis not present

## 2018-03-19 DIAGNOSIS — S83411A Sprain of medial collateral ligament of right knee, initial encounter: Secondary | ICD-10-CM | POA: Diagnosis not present

## 2018-03-19 LAB — PULMONARY FUNCTION TEST
DL/VA % pred: 106 %
DL/VA: 5.09 ml/min/mmHg/L
DLCO unc % pred: 98 %
DLCO unc: 23.96 ml/min/mmHg
FEF 25-75 Post: 1.57 L/s
FEF 25-75 Pre: 1.28 L/s
FEF2575-%Change-Post: 22 %
FEF2575-%Pred-Post: 65 %
FEF2575-%Pred-Pre: 53 %
FEV1-%Change-Post: 7 %
FEV1-%Pred-Post: 82 %
FEV1-%Pred-Pre: 77 %
FEV1-Post: 2.14 L
FEV1-Pre: 1.99 L
FEV1FVC-%Change-Post: 5 %
FEV1FVC-%Pred-Pre: 85 %
FEV6-%Change-Post: 2 %
FEV6-%Pred-Post: 94 %
FEV6-%Pred-Pre: 92 %
FEV6-Post: 3.03 L
FEV6-Pre: 2.96 L
FEV6FVC-%Change-Post: 0 %
FEV6FVC-%Pred-Post: 103 %
FEV6FVC-%Pred-Pre: 103 %
FVC-%Change-Post: 2 %
FVC-%Pred-Post: 90 %
FVC-%Pred-Pre: 89 %
FVC-Post: 3.03 L
FVC-Pre: 2.97 L
Post FEV1/FVC ratio: 71 %
Post FEV6/FVC ratio: 100 %
Pre FEV1/FVC ratio: 67 %
Pre FEV6/FVC Ratio: 100 %
RV % pred: 124 %
RV: 2.44 L
TLC % pred: 107 %
TLC: 5.46 L

## 2018-03-19 MED ORDER — MONTELUKAST SODIUM 10 MG PO TABS: 10.0000 mg | ORAL_TABLET | Freq: Every day | ORAL | 8 refills | Status: DC

## 2018-03-19 NOTE — Telephone Encounter (Signed)
Glad she had flu shot. Please document it and please ensure it was for this season 2019-202. Also recommend pneumovax - she can come and get it or have with PCP Georgann HousekeeperHusain, Karrar, MD

## 2018-03-19 NOTE — Patient Instructions (Addendum)
ICD-10-CM   1. Asthma, unspecified asthma severity, unspecified whether complicated, unspecified whether persistent J45.909    Mild asthma on pft No evidence of copd on pft  Plan - respect concern over bone risk with inhaled steroids and advair causing irritable throat - recommend starting singulair 10mg  (generic montelukast) once daily at night - warm-up and cool down for exercise - albuterol 10 min before racketball  Followup 6-9 months from now; call us or return sooner if needed

## 2018-03-19 NOTE — Telephone Encounter (Signed)
Triage  Patient has not had flu shot . Please call her and let her know I recommend flu and pneumonia vaccine. Before I realized she had not had them she was gone in < 1 minute . I had Lauren call her to see if she can drive back to get flu shot but [facility]  Please call her and see if wants to come in or go to [facility] and get it     SIGNATURE    Dr. Kalman ShanMurali Keelen Quevedo, M.D., F.C.C.P,  Pulmonary and Critical Care Medicine Staff Physician, [facility] Center Director - Interstitial Lung Disease  Program  Pulmonary Fibrosis [facility] [place], [place], [id number]  Pager: [phone], If no answer or between  15:00h - 7:00h: call [phone] Telephone: [phone]  12:33 PM 03/19/2018

## 2018-03-19 NOTE — Telephone Encounter (Signed)
Called patient unable to reach left message to give us a call back.

## 2018-03-19 NOTE — Telephone Encounter (Signed)
Pt said that she had the flu shot   [phone]

## 2018-03-19 NOTE — Telephone Encounter (Signed)
Called patient unable to reach left message to give us a call back. Will route to MR as an FYI.

## 2018-03-19 NOTE — Progress Notes (Signed)
08/11/10- 26 yoF former smoker followed for chronic obstructive asthma/ minimal COPD, allergic rhinitis.  Last here March 14 for initial visit. No significant changes since that visit- note reviewed.  She has taken some antihistamine but pollen season hasn't been bad. PFT- 08/10/09- FEV1 2.29/93%, FEV1/FVC 0.66, FEF 25-75 1.38/ 49% w/ response to BD  DLCO 98% a1AT- M/S functionally normal variant CXR - 06/21/10- apical scarring, NAD.    OV 02/05/2018  Subjective:  Patient ID: Cindy Decker, female , DOB: 12-20-1960 , age 57 y.o. , MRN: [id number] , ADDRESS: [address] [place] [address]   02/05/2018 -   Chief Complaint  Patient presents with  . Consult    seen back in 2012 by CY for asthma, would like more opinions      HPI Cindy Decker 57 y.o. -is a new consult.  Last seen by Dr. Jetty Duhamel in 2012.  Because it is been more than 3 years there is a new evaluation.  Review of the notes from 2012 indicate MS alpha-1 variant.  Asthma was considered mild persistent with above pulmonary function test.  She was then on Advair.  She tells me she has had an asthma diagnosis since age 28 which was 29 years ago.  Since then it has been stable.  For at least 10 or 12 years she is been on Advair mostly taking it intermittently but for the last 2 years she takes it scale on a scheduled basis.  If she does not take her Advair her symptoms do get worse but only by a mild degree.  When she is on Advair she feels better but she continues to have persistent symptoms again at a mild level.  In particular her symptoms are that of cough at a level 3 which is the most significant of symptoms.  And shortness of breath, chest tightness and clearing of the throat at level 2.  Early morning she does bring some clear sputum again rated at level 2 out of 5.  She barely has any wheezing and rated as a level 0-1.  Definitely no orthopnea proximal nocturnal dyspnea.  The cough is particularly worse  when she begins to play racquetball but later as she warms up and starts playing the cough does get better.  For racquetball she has no shortness of breath.  In doing the asthma control questionnaire she says she does not wake up at night because of asthma symptoms when she wakes up she has moderate amount of symptoms and she feels very slightly limited because of asthma and a little shortness of breath and a little of the time she is wheezing but does not use albuterol for rescue.  Her exam nitric oxide today  - is 10 ppb and normal and this is on Advair.  Of note, she does have allergies to dogs and the fall season.  He recollects allergy testing in the 1990s which was positive.  She took allergy shots for a year back then and did not help.  Her mom has COPD  Her biggest concern is that she wants to improve the quality of her cough and the quantity of a cough.  This is because she is been diagnosed with osteoporosis and she is worried about rib fractures.  She is somewhat cost conscious.  Because of a high deductible health plan.     2012 PFT image reviewed and visulaized  Chest x-ray March 2012: Personally visualized the image.  Appears to have hyperinflation. That  was the last chest x-ray in our health system.   OV 03/19/2018  Subjective:  Patient ID: Cindy Decker, female , DOB: [birth year] , age 57 y.o. , MRN: [id number] , AD[address]ESS: [address] [place] [address]   03/19/2018 -   Chief Complaint  Patient presents with  . Follow-up    Breathing is improved since last OV. PFT was completed today.     HPI Cindy Decker 57 y.o. -presents for shortness of breath follow-up.  In the interim she stopped her Advair because she thought I sent she did not have asthma.  In fact after stopping it she actually feels better.  She really does not want to do Advair anymore because of the concern of osteoporosis and also because it is scratchy to her throat and causes irritable throat.  She had  pony function test today that shows mild obstruction FEV1 77% with 7% bronchodilator response.  This is off the Advair for 6 weeks.  No interim respiratory issues.  She was here to review the PFT.  Immunization review done post discharge and realize she not had the flu shot.  We will call her and let her know to have the flu shot.     ROS - per HPI     has a past medical history of ALLERGIC RHINITIS, Asthma, and Interstitial cystitis.   reports that she quit smoking about 32 years ago. She has never used smokeless tobacco.  Past Surgical History:  Procedure Laterality Date  . c-spine fusion for neck fx    . CYSTOSCOPY      Allergies  Allergen Reactions  . Nitrofurantoin      There is no immunization history on file for this patient.  Family History  Problem Relation Age of Onset  . Breast cancer Other   . Allergies Other   . Heart disease Other   . COPD Mother      Current Outpatient Medications:  .  cetirizine (ZYRTEC) 10 MG tablet, Take 10 mg by mouth daily., Disp: , Rfl:  .  cholecalciferol (VITAMIN D) 1000 units tablet, Take 1,000 Units by mouth daily., Disp: , Rfl:  .  ferrous sulfate 325 (65 FE) MG tablet, Take 325 mg by mouth daily with breakfast.  , Disp: , Rfl:  .  montelukast (SINGULAIR) 10 MG tablet, Take 1 tablet (10 mg total) by mouth at bedtime., Disp: 30 tablet, Rfl: 8      Objective:   Vitals:   03/19/18 1151  BP: 110/80  Pulse: 82  SpO2: 96%  Weight: 147 lb (66.7 kg)  Height: 5\' 4"  (1.626 m)    Estimated body mass index is 25.23 kg/m as calculated from the following:   Height as of this encounter: 5\' 4"  (1.626 m).   Weight as of this encounter: 147 lb (66.7 kg).  @WEIGHTCHANGE @  [employer]   03/19/18 1151  Weight: 147 lb (66.7 kg)     Physical Exam  General Appearance:    Alert, cooperative, no distress, appears stated age - yes , Deconditioned looking - no , OBESE  - no, Sitting on Wheelchair -  no  Head:    Normocephalic,  without obvious abnormality, atraumatic  Eyes:    PERRL, conjunctiva/corneas clear,  Ears:    Normal TM's and external ear canals, both ears  Nose:   Nares normal, septum midline, mucosa normal, no drainage    or sinus tenderness. OXYGEN ON  -  no . Patient is @ ra   Throat:   Lips, mucosa, and tongue normal; teeth and gums normal. Cyanosis on lips - no  Neck:   Supple, symmetrical, trachea midline, no adenopathy;    thyroid:  no enlargement/tenderness/nodules; no carotid   bruit or JVD  Back:     Symmetric, no curvature, ROM normal, no CVA tenderness  Lungs:     Distress - no , Wheeze no, Barrell Chest - no, Purse lip breathing - no, Crackles - no   Chest Wall:    No tenderness or deformity.    Heart:    Regular rate and rhythm, S1 and S2 normal, no rub   or gallop, Murmur - no  Breast Exam:    NOT DONE  Abdomen:     Soft, non-tender, bowel sounds active all four quadrants,    no masses, no organomegaly. Visceral obesity - no  Genitalia:   NOT DONE  Rectal:   NOT DONE  Extremities:   Extremities - normal, Has Cane - no, Clubbing - no, Edema - no  Pulses:   2+ and symmetric all extremities  Skin:   Stigmata of Connective Tissue Disease - no  Lymph nodes:   Cervical, supraclavicular, and axillary nodes normal  Psychiatric:  Neurologic:   Pleasant - yes, Anxious - no, Flat affect - no  CAm-ICU - neg, Alert and Oriented x 3 - yes, Moves all 4s - yes, Speech - normal, Cognition - intact           Assessment:       ICD-10-CM   1. Asthma, unspecified asthma severity, unspecified whether complicated, unspecified whether persistent J45.909    I think she has mild asthma.  I think given the fact she plays racquetball she will benefit from being on a maintenance inhaler but she does not want to do maintenance steroid because of osteoporosis.  Therefore the next best option is to do montelukast daily once daily at night.  I also advised warm up and cool down exercises.  She is agreeable  with this plan.  I realize she had not had a flu shot just after she left the office.  By the time I went back into the room she had walked into the car.    Plan:     Patient Instructions     ICD-10-CM   1. Asthma, unspecified asthma severity, unspecified whether complicated, unspecified whether persistent J45.909    Mild asthma on pft No evidence of copd on pft  Plan - respect concern over bone risk with inhaled steroids and advair causing irritable throat - recommend starting singulair 10mg  (generic montelukast) once daily at night - warm-up and cool down for exercise - albuterol 10 min before racketball  Followup 6-9 months from now; call us or return sooner if needed     SIGNATURE    Dr. Kalman ShanMurali Lydiah Pong, M.D., F.C.C.P,  Pulmonary and Critical Care Medicine Staff Physician, [facility] Director - Interstitial Lung Disease  Program  Pulmonary Fibrosis [facility] at [facility] [place], [place], [id number]  Pager: [phone], If no answer or between  15:00h - 7:00h: call [phone] Telephone: [phone]  12:30 PM 03/19/2018

## 2018-03-19 NOTE — Progress Notes (Signed)
PFT completed today.  

## 2018-04-07 DIAGNOSIS — S83411D Sprain of medial collateral ligament of right knee, subsequent encounter: Secondary | ICD-10-CM | POA: Diagnosis not present

## 2018-04-16 DIAGNOSIS — M6281 Muscle weakness (generalized): Secondary | ICD-10-CM | POA: Diagnosis not present

## 2018-04-16 DIAGNOSIS — S83411D Sprain of medial collateral ligament of right knee, subsequent encounter: Secondary | ICD-10-CM | POA: Diagnosis not present

## 2018-04-16 DIAGNOSIS — M25561 Pain in right knee: Secondary | ICD-10-CM | POA: Diagnosis not present

## 2018-04-23 DIAGNOSIS — S83411D Sprain of medial collateral ligament of right knee, subsequent encounter: Secondary | ICD-10-CM | POA: Diagnosis not present

## 2018-04-23 DIAGNOSIS — M25561 Pain in right knee: Secondary | ICD-10-CM | POA: Diagnosis not present

## 2018-04-23 DIAGNOSIS — M6281 Muscle weakness (generalized): Secondary | ICD-10-CM | POA: Diagnosis not present

## 2018-04-25 DIAGNOSIS — S83411D Sprain of medial collateral ligament of right knee, subsequent encounter: Secondary | ICD-10-CM | POA: Diagnosis not present

## 2018-04-25 DIAGNOSIS — M6281 Muscle weakness (generalized): Secondary | ICD-10-CM | POA: Diagnosis not present

## 2018-04-25 DIAGNOSIS — M25561 Pain in right knee: Secondary | ICD-10-CM | POA: Diagnosis not present

## 2018-05-01 DIAGNOSIS — M6281 Muscle weakness (generalized): Secondary | ICD-10-CM | POA: Diagnosis not present

## 2018-05-01 DIAGNOSIS — S83411D Sprain of medial collateral ligament of right knee, subsequent encounter: Secondary | ICD-10-CM | POA: Diagnosis not present

## 2018-05-01 DIAGNOSIS — M25561 Pain in right knee: Secondary | ICD-10-CM | POA: Diagnosis not present

## 2018-05-05 DIAGNOSIS — M25561 Pain in right knee: Secondary | ICD-10-CM | POA: Diagnosis not present

## 2018-05-23 DIAGNOSIS — M7752 Other enthesopathy of left foot: Secondary | ICD-10-CM | POA: Diagnosis not present

## 2018-05-29 DIAGNOSIS — J45909 Unspecified asthma, uncomplicated: Secondary | ICD-10-CM | POA: Diagnosis not present

## 2018-06-01 DIAGNOSIS — J209 Acute bronchitis, unspecified: Secondary | ICD-10-CM | POA: Diagnosis not present

## 2018-06-01 DIAGNOSIS — J069 Acute upper respiratory infection, unspecified: Secondary | ICD-10-CM | POA: Diagnosis not present

## 2018-06-05 ENCOUNTER — Encounter: Payer: Self-pay | Admitting: Internal Medicine

## 2018-06-09 DIAGNOSIS — M79672 Pain in left foot: Secondary | ICD-10-CM | POA: Diagnosis not present

## 2018-10-03 DIAGNOSIS — M810 Age-related osteoporosis without current pathological fracture: Secondary | ICD-10-CM | POA: Diagnosis not present

## 2018-10-03 DIAGNOSIS — Z85828 Personal history of other malignant neoplasm of skin: Secondary | ICD-10-CM | POA: Diagnosis not present

## 2018-10-03 DIAGNOSIS — Z08 Encounter for follow-up examination after completed treatment for malignant neoplasm: Secondary | ICD-10-CM | POA: Diagnosis not present

## 2018-10-03 DIAGNOSIS — H9390 Unspecified disorder of ear, unspecified ear: Secondary | ICD-10-CM | POA: Diagnosis not present

## 2018-10-03 DIAGNOSIS — J45909 Unspecified asthma, uncomplicated: Secondary | ICD-10-CM | POA: Diagnosis not present

## 2018-10-03 DIAGNOSIS — L821 Other seborrheic keratosis: Secondary | ICD-10-CM | POA: Diagnosis not present

## 2018-10-07 DIAGNOSIS — Z01419 Encounter for gynecological examination (general) (routine) without abnormal findings: Secondary | ICD-10-CM | POA: Diagnosis not present

## 2018-10-07 DIAGNOSIS — Z1231 Encounter for screening mammogram for malignant neoplasm of breast: Secondary | ICD-10-CM | POA: Diagnosis not present

## 2018-10-07 DIAGNOSIS — Z6827 Body mass index (BMI) 27.0-27.9, adult: Secondary | ICD-10-CM | POA: Diagnosis not present

## 2019-02-02 ENCOUNTER — Other Ambulatory Visit: Payer: Self-pay | Admitting: Internal Medicine

## 2019-02-02 DIAGNOSIS — M810 Age-related osteoporosis without current pathological fracture: Secondary | ICD-10-CM | POA: Diagnosis not present

## 2019-02-02 DIAGNOSIS — Z0000 Encounter for general adult medical examination without abnormal findings: Secondary | ICD-10-CM | POA: Diagnosis not present

## 2019-02-02 DIAGNOSIS — Z13220 Encounter for screening for lipoid disorders: Secondary | ICD-10-CM | POA: Diagnosis not present

## 2019-02-02 DIAGNOSIS — Z23 Encounter for immunization: Secondary | ICD-10-CM | POA: Diagnosis not present

## 2019-04-28 ENCOUNTER — Ambulatory Visit
Admission: RE | Admit: 2019-04-28 | Discharge: 2019-04-28 | Disposition: A | Discharge status: Home / Self Care | Payer: BLUE CROSS/BLUE SHIELD | Source: Ambulatory Visit | Attending: Internal Medicine | Admitting: Internal Medicine

## 2019-04-28 ENCOUNTER — Other Ambulatory Visit: Payer: Self-pay

## 2019-04-28 DIAGNOSIS — M810 Age-related osteoporosis without current pathological fracture: Secondary | ICD-10-CM

## 2019-04-28 DIAGNOSIS — M8589 Other specified disorders of bone density and structure, multiple sites: Secondary | ICD-10-CM | POA: Diagnosis not present

## 2019-04-28 DIAGNOSIS — Z780 Asymptomatic menopausal state: Secondary | ICD-10-CM | POA: Diagnosis not present

## 2019-06-05 DIAGNOSIS — Z23 Encounter for immunization: Secondary | ICD-10-CM | POA: Diagnosis not present

## 2019-08-04 DIAGNOSIS — J309 Allergic rhinitis, unspecified: Secondary | ICD-10-CM | POA: Diagnosis not present

## 2019-08-04 DIAGNOSIS — J45909 Unspecified asthma, uncomplicated: Secondary | ICD-10-CM | POA: Diagnosis not present

## 2019-10-06 DIAGNOSIS — R300 Dysuria: Secondary | ICD-10-CM | POA: Diagnosis not present

## 2019-10-06 DIAGNOSIS — N3000 Acute cystitis without hematuria: Secondary | ICD-10-CM | POA: Diagnosis not present

## 2019-11-03 DIAGNOSIS — Z6827 Body mass index (BMI) 27.0-27.9, adult: Secondary | ICD-10-CM | POA: Diagnosis not present

## 2019-11-03 DIAGNOSIS — Z124 Encounter for screening for malignant neoplasm of cervix: Secondary | ICD-10-CM | POA: Diagnosis not present

## 2019-11-03 DIAGNOSIS — Z01419 Encounter for gynecological examination (general) (routine) without abnormal findings: Secondary | ICD-10-CM | POA: Diagnosis not present

## 2019-11-12 DIAGNOSIS — M25551 Pain in right hip: Secondary | ICD-10-CM | POA: Diagnosis not present

## 2019-11-26 DIAGNOSIS — Z1231 Encounter for screening mammogram for malignant neoplasm of breast: Secondary | ICD-10-CM | POA: Diagnosis not present

## 2019-12-15 DIAGNOSIS — Z6827 Body mass index (BMI) 27.0-27.9, adult: Secondary | ICD-10-CM | POA: Diagnosis not present

## 2019-12-15 DIAGNOSIS — N9089 Other specified noninflammatory disorders of vulva and perineum: Secondary | ICD-10-CM | POA: Diagnosis not present

## 2020-02-02 DIAGNOSIS — Z13220 Encounter for screening for lipoid disorders: Secondary | ICD-10-CM | POA: Diagnosis not present

## 2020-02-02 DIAGNOSIS — Z0000 Encounter for general adult medical examination without abnormal findings: Secondary | ICD-10-CM | POA: Diagnosis not present

## 2020-02-02 DIAGNOSIS — Z23 Encounter for immunization: Secondary | ICD-10-CM | POA: Diagnosis not present

## 2020-03-17 DIAGNOSIS — R309 Painful micturition, unspecified: Secondary | ICD-10-CM | POA: Diagnosis not present

## 2020-03-17 DIAGNOSIS — N342 Other urethritis: Secondary | ICD-10-CM | POA: Diagnosis not present

## 2020-05-03 DIAGNOSIS — L820 Inflamed seborrheic keratosis: Secondary | ICD-10-CM | POA: Diagnosis not present

## 2020-05-03 DIAGNOSIS — Z08 Encounter for follow-up examination after completed treatment for malignant neoplasm: Secondary | ICD-10-CM | POA: Diagnosis not present

## 2020-05-03 DIAGNOSIS — Z85828 Personal history of other malignant neoplasm of skin: Secondary | ICD-10-CM | POA: Diagnosis not present

## 2020-05-03 DIAGNOSIS — X32XXXD Exposure to sunlight, subsequent encounter: Secondary | ICD-10-CM | POA: Diagnosis not present

## 2020-05-03 DIAGNOSIS — L570 Actinic keratosis: Secondary | ICD-10-CM | POA: Diagnosis not present

## 2020-05-23 DIAGNOSIS — R3982 Chronic bladder pain: Secondary | ICD-10-CM | POA: Diagnosis not present

## 2020-05-23 DIAGNOSIS — R300 Dysuria: Secondary | ICD-10-CM | POA: Diagnosis not present

## 2020-06-02 DIAGNOSIS — N393 Stress incontinence (female) (male): Secondary | ICD-10-CM | POA: Diagnosis not present

## 2020-06-02 DIAGNOSIS — M62838 Other muscle spasm: Secondary | ICD-10-CM | POA: Diagnosis not present

## 2020-06-02 DIAGNOSIS — M6289 Other specified disorders of muscle: Secondary | ICD-10-CM | POA: Diagnosis not present

## 2020-06-02 DIAGNOSIS — M6281 Muscle weakness (generalized): Secondary | ICD-10-CM | POA: Diagnosis not present

## 2020-06-17 DIAGNOSIS — R3982 Chronic bladder pain: Secondary | ICD-10-CM | POA: Diagnosis not present

## 2020-06-17 DIAGNOSIS — M6281 Muscle weakness (generalized): Secondary | ICD-10-CM | POA: Diagnosis not present

## 2020-06-17 DIAGNOSIS — M6289 Other specified disorders of muscle: Secondary | ICD-10-CM | POA: Diagnosis not present

## 2020-06-17 DIAGNOSIS — M62838 Other muscle spasm: Secondary | ICD-10-CM | POA: Diagnosis not present

## 2020-07-13 DIAGNOSIS — H8110 Benign paroxysmal vertigo, unspecified ear: Secondary | ICD-10-CM | POA: Diagnosis not present

## 2020-12-05 DIAGNOSIS — Z1231 Encounter for screening mammogram for malignant neoplasm of breast: Secondary | ICD-10-CM | POA: Diagnosis not present

## 2020-12-05 DIAGNOSIS — Z6827 Body mass index (BMI) 27.0-27.9, adult: Secondary | ICD-10-CM | POA: Diagnosis not present

## 2020-12-05 DIAGNOSIS — Z01419 Encounter for gynecological examination (general) (routine) without abnormal findings: Secondary | ICD-10-CM | POA: Diagnosis not present

## 2021-02-03 DIAGNOSIS — Z23 Encounter for immunization: Secondary | ICD-10-CM | POA: Diagnosis not present

## 2021-02-03 DIAGNOSIS — Z0000 Encounter for general adult medical examination without abnormal findings: Secondary | ICD-10-CM | POA: Diagnosis not present

## 2021-02-03 DIAGNOSIS — Z13220 Encounter for screening for lipoid disorders: Secondary | ICD-10-CM | POA: Diagnosis not present

## 2021-03-25 DIAGNOSIS — Z03818 Encounter for observation for suspected exposure to other biological agents ruled out: Secondary | ICD-10-CM | POA: Diagnosis not present

## 2021-03-25 DIAGNOSIS — Z20822 Contact with and (suspected) exposure to covid-19: Secondary | ICD-10-CM | POA: Diagnosis not present

## 2021-06-05 DIAGNOSIS — H2513 Age-related nuclear cataract, bilateral: Secondary | ICD-10-CM | POA: Diagnosis not present

## 2021-06-05 DIAGNOSIS — H43812 Vitreous degeneration, left eye: Secondary | ICD-10-CM | POA: Diagnosis not present

## 2021-06-05 DIAGNOSIS — H43811 Vitreous degeneration, right eye: Secondary | ICD-10-CM | POA: Diagnosis not present

## 2021-06-05 DIAGNOSIS — H40033 Anatomical narrow angle, bilateral: Secondary | ICD-10-CM | POA: Diagnosis not present

## 2021-06-05 DIAGNOSIS — H43813 Vitreous degeneration, bilateral: Secondary | ICD-10-CM | POA: Diagnosis not present

## 2021-07-13 ENCOUNTER — Encounter: Payer: Self-pay | Admitting: Pulmonary Disease

## 2021-07-13 ENCOUNTER — Ambulatory Visit: Payer: BC Managed Care – PPO | Admitting: Pulmonary Disease

## 2021-07-13 VITALS — BP 104/60 | HR 67 | Ht 64.0 in | Wt 146.4 lb

## 2021-07-13 DIAGNOSIS — J4530 Mild persistent asthma, uncomplicated: Secondary | ICD-10-CM | POA: Diagnosis not present

## 2021-07-13 MED ORDER — FLUTICASONE-SALMETEROL 230-21 MCG/ACT IN AERO: 2.0000 | INHALATION_SPRAY | Freq: Two times a day (BID) | RESPIRATORY_TRACT | 12 refills | Status: DC

## 2021-07-13 NOTE — Progress Notes (Signed)
? ? ?Subjective:  ? ?PATIENT ID: Cindy Decker GENDER: female DOB: 1960-09-15, MRN: [id number] ? ? ?HPI ? ?Chief Complaint  ?Patient presents with  ? Consult  ?  Asthma, had covid end of Jan and cough has lingered since    ? ? ?Reason for Visit: New consult for asthma ? ?Cindy Decker is a 61 year old female with allergic rhinitis and asthma who presents as a new consult for asthma. ? ?Previously seen by MR in 2019 for asthma. ?She was diagnosed with asthma in her early 50s. Previously had bronchitis as a teenager when she smoked. She is Advair Diskus 250-50 mcg once a day. Rarely uses albuterol. She was diagnosed with COVID in January 2023. She reports cough, usually when she wakes up. No nocturnal cough. Associated with chest tightness. Denies shortness of breath. She takes Zyrtec daily. Has rhinorrhea and nasal congestion. Stress related to work. Active at baseline. Walks 1.5 mile daily on treadmill and yoga at night. ? ?Social History: ?Former smoker. Quit in 1987. Started 14. Smoked 12 years x 1.5 ppd. Smoked MJ >40 years ago. ?Theatre manager x 40 years ? ?I have personally reviewed patient's past medical/family/social history, allergies, current medications. ? ?Past Medical History:  ?Diagnosis Date  ? ALLERGIC RHINITIS   ? Asthma   ? Interstitial cystitis   ?  ? ?Family History  ?Problem Relation Age of Onset  ? Breast cancer Other   ? Allergies Other   ? Heart disease Other   ? COPD Mother   ?  ? ?Social History  ? ?Occupational History  ? Occupation: Human resources officer  ?Tobacco Use  ? Smoking status: Former  ?  Packs/day: 1.00  ?  Years: 11.00  ?  Pack years: 11.00  ?  Types: Cigarettes  ?  Quit date: 03/09/1986  ?  Years since quitting: 35.3  ? Smokeless tobacco: Never  ?Substance and Sexual Activity  ? Alcohol use: No  ? Drug use: Not on file  ? Sexual activity: Not on file  ? ? ?Allergies  ?Allergen Reactions  ? Nitrofurantoin   ?  ? ?Outpatient Medications Prior to  Visit  ?Medication Sig Dispense Refill  ? alendronate (FOSAMAX) 70 MG tablet     ? cetirizine (ZYRTEC) 10 MG tablet Take 10 mg by mouth daily.    ? cholecalciferol (VITAMIN D) 1000 units tablet Take 1,000 Units by mouth daily.    ? ferrous sulfate 325 (65 FE) MG tablet Take 325 mg by mouth daily with breakfast.      ? montelukast (SINGULAIR) 10 MG tablet Take 1 tablet (10 mg total) by mouth at bedtime. 30 tablet 8  ? fluticasone-salmeterol (ADVAIR) 250-50 MCG/ACT AEPB Advair Diskus 250 mcg-50 mcg/dose powder for inhalation    ? ?No facility-administered medications prior to visit.  ? ? ?Review of Systems  ?Constitutional:  Negative for chills, diaphoresis, fever, malaise/fatigue and weight loss.  ?HENT:  Positive for congestion.   ?Respiratory:  Positive for cough. Negative for hemoptysis, sputum production, shortness of breath and wheezing.   ?Cardiovascular:  Positive for chest pain (chest tightness). Negative for palpitations and leg swelling.  ? ? ?Objective:  ? ?Vitals:  ? 07/13/21 1607  ?BP: 104/60  ?Pulse: 67  ?SpO2: 96%  ?Weight: 146 lb 6.4 oz (66.4 kg)  ?Height: 5\' 4"  (1.626 m)  ? ?SpO2: 96 % ?O2 Device: None (Room air) ? ?Physical Exam: ?General: Well-appearing, no acute distress ?HENT: [place], AT ?Eyes: EOMI, no  scleral icterus ?Respiratory: Clear to auscultation bilaterally.  No crackles, wheezing or rales ?Cardiovascular: RRR, -M/R/G, no JVD ?Extremities:-Edema,-tenderness ?Neuro: AAO x4, CNII-XII grossly intact ?Psych: Normal mood, normal affect ? ?Data Reviewed: ? ?Imaging: ?CXR 06/21/10 - Mild hyperinflation. No infiltrate, effusion or edema ? ?PFT: ?03/19/18 ?FVC 3.06 (90%) FEV1 2.14 (82%) Ratio 67  TLC 107%  RV 124% DLCO 98% ?Interpretation: Mild obstructive defect with air trapping ? ?Labs: ?CBC ?No results found for: WBC, RBC, HGB, HCT, PLT, MCV, MCH, MCHC, RDW, LYMPHSABS, MONOABS, EOSABS, BASOSABS ? ? ?   ?Assessment & Plan:  ? ?Discussion: ?61 year old female with allergic rhinitis and asthma who  presents as a new consult for asthma. Discussed clinical course and management of asthma including bronchodilator regimen and action plan for exacerbation. Offered singulair however declined. She prefers to on minimal medication unless needed. ? ?Mild persistent asthma ?--START Advair HFA 230-21 TWO puffs TWICE a day. Rinse mouth after use to prevent thrush ?--CONTINUE Albuterol AS NEEDED for shortness of breath and wheezing ?--ORDER labs: CBC with diff, IgE ?--ORDER pulmonary function test ? ?Health Maintenance ? ?There is no immunization history on file for this patient. ?CT Lung Screen - Quit >15 years ago ? ?Orders Placed This Encounter  ?Procedures  ? CBC w/Diff  ?  Standing Status:   Future  ?  Number of Occurrences:   1  ?  Standing Expiration Date:   07/13/2022  ? IgE  ?  Standing Status:   Future  ?  Number of Occurrences:   1  ?  Standing Expiration Date:   07/14/2022  ? Pulmonary function test  ?  Standing Status:   Future  ?  Standing Expiration Date:   07/14/2022  ?  Order Specific Question:   Where should this test be performed?  ?  Answer:   [facility]  ?  Order Specific Question:   Full PFT: includes the following: basic spirometry, spirometry pre & post bronchodilator, diffusion capacity (DLCO), lung volumes  ?  Answer:   Full PFT  ? ?Meds ordered this encounter  ?Medications  ? fluticasone-salmeterol (ADVAIR HFA) 230-21 MCG/ACT inhaler  ?  Sig: Inhale 2 puffs into the lungs 2 (two) times daily.  ?  Dispense:  1 each  ?  Refill:  12  ? ? ?Return in about 3 months (around 10/12/2021). ? ?I have spent a total time of 45-minutes on the day of the appointment reviewing prior documentation, coordinating care and discussing medical diagnosis and plan with the patient/family. Imaging, labs and tests included in this note have been reviewed and interpreted independently by me. ? ?Emylia Latella Rodman Pickle, MD ?[facility] ?07/13/2021 5:18 PM  ?Office Number [phone] ? ? ?

## 2021-07-13 NOTE — Patient Instructions (Signed)
Mild persistent asthma ?--START Advair HFA 230-21 TWO puffs TWICE a day. Rinse mouth after use to prevent thrush ?--CONTINUE Albuterol AS NEEDED for shortness of breath and wheezing ?--ORDER labs: CBC with diff, IgE ?--ORDER pulmonary function test ? ?Follow-up with me in 3 months with PFTs prior to visit ?

## 2021-07-14 DIAGNOSIS — J4530 Mild persistent asthma, uncomplicated: Secondary | ICD-10-CM | POA: Diagnosis not present

## 2021-07-14 LAB — CBC WITH DIFFERENTIAL/PLATELET
Absolute Monocytes: 398 cells/uL (ref 200–950)
Basophils Absolute: 31 cells/uL (ref 0–200)
Basophils Relative: 0.6 %
Eosinophils Absolute: 82 cells/uL (ref 15–500)
Eosinophils Relative: 1.6 %
HCT: 40.9 % (ref 35.0–45.0)
Hemoglobin: 13.6 g/dL (ref 11.7–15.5)
Lymphs Abs: 1250 cells/uL (ref 850–3900)
MCH: 31.6 pg (ref 27.0–33.0)
MCHC: 33.3 g/dL (ref 32.0–36.0)
MCV: 95.1 fL (ref 80.0–100.0)
MPV: 12.1 fL (ref 7.5–12.5)
Monocytes Relative: 7.8 %
Neutro Abs: 3341 cells/uL (ref 1500–7800)
Neutrophils Relative %: 65.5 %
Platelets: 179 10*3/uL (ref 140–400)
RBC: 4.3 10*6/uL (ref 3.80–5.10)
RDW: 12.3 % (ref 11.0–15.0)
Total Lymphocyte: 24.5 %
WBC: 5.1 10*3/uL (ref 3.8–10.8)

## 2021-07-14 LAB — IGE: IgE (Immunoglobulin E), Serum: 20 kU/L (ref <=114)

## 2021-07-14 NOTE — Addendum Note (Signed)
Addended by: Rhys Anchondo E on: 07/14/2021 10:31 AM ? ? Modules accepted: Orders ? ?

## 2021-07-14 NOTE — Addendum Note (Signed)
Addended by: Suzzanne Cloud E on: 07/14/2021 10:31 AM ? ? Modules accepted: Orders ? ?

## 2021-09-11 DIAGNOSIS — H2513 Age-related nuclear cataract, bilateral: Secondary | ICD-10-CM | POA: Diagnosis not present

## 2021-09-11 DIAGNOSIS — H5213 Myopia, bilateral: Secondary | ICD-10-CM | POA: Diagnosis not present

## 2021-09-11 DIAGNOSIS — H43811 Vitreous degeneration, right eye: Secondary | ICD-10-CM | POA: Diagnosis not present

## 2021-10-03 DIAGNOSIS — X32XXXD Exposure to sunlight, subsequent encounter: Secondary | ICD-10-CM | POA: Diagnosis not present

## 2021-10-03 DIAGNOSIS — L570 Actinic keratosis: Secondary | ICD-10-CM | POA: Diagnosis not present

## 2021-10-03 DIAGNOSIS — R208 Other disturbances of skin sensation: Secondary | ICD-10-CM | POA: Diagnosis not present

## 2021-10-20 DIAGNOSIS — Z1211 Encounter for screening for malignant neoplasm of colon: Secondary | ICD-10-CM | POA: Diagnosis not present

## 2021-10-20 DIAGNOSIS — K635 Polyp of colon: Secondary | ICD-10-CM | POA: Diagnosis not present

## 2021-10-23 ENCOUNTER — Ambulatory Visit: Payer: BC Managed Care – PPO | Admitting: Pulmonary Disease

## 2021-12-25 DIAGNOSIS — Z01419 Encounter for gynecological examination (general) (routine) without abnormal findings: Secondary | ICD-10-CM | POA: Diagnosis not present

## 2021-12-25 DIAGNOSIS — Z6826 Body mass index (BMI) 26.0-26.9, adult: Secondary | ICD-10-CM | POA: Diagnosis not present

## 2021-12-25 DIAGNOSIS — Z1231 Encounter for screening mammogram for malignant neoplasm of breast: Secondary | ICD-10-CM | POA: Diagnosis not present

## 2021-12-29 ENCOUNTER — Ambulatory Visit (INDEPENDENT_AMBULATORY_CARE_PROVIDER_SITE_OTHER): Payer: BC Managed Care – PPO | Admitting: Pulmonary Disease

## 2021-12-29 ENCOUNTER — Ambulatory Visit: Payer: BC Managed Care – PPO | Admitting: Pulmonary Disease

## 2021-12-29 ENCOUNTER — Encounter: Payer: Self-pay | Admitting: Pulmonary Disease

## 2021-12-29 VITALS — BP 100/62 | HR 60 | Ht 64.0 in | Wt 144.0 lb

## 2021-12-29 DIAGNOSIS — J4530 Mild persistent asthma, uncomplicated: Secondary | ICD-10-CM

## 2021-12-29 DIAGNOSIS — J42 Unspecified chronic bronchitis: Secondary | ICD-10-CM

## 2021-12-29 LAB — PULMONARY FUNCTION TEST
DL/VA % pred: 129 %
DL/VA: 5.42 ml/min/mmHg/L
DLCO cor % pred: 114 %
DLCO cor: 22.86 ml/min/mmHg
DLCO unc % pred: 114 %
DLCO unc: 22.86 ml/min/mmHg
FEF 25-75 Post: 1.43 L/sec
FEF 25-75 Pre: 1.15 L/sec
FEF2575-%Change-Post: 24 %
FEF2575-%Pred-Post: 64 %
FEF2575-%Pred-Pre: 52 %
FEV1-%Change-Post: 6 %
FEV1-%Pred-Post: 82 %
FEV1-%Pred-Pre: 77 %
FEV1-Post: 2.04 L
FEV1-Pre: 1.93 L
FEV1FVC-%Change-Post: 2 %
FEV1FVC-%Pred-Pre: 85 %
FEV6-%Change-Post: 4 %
FEV6-%Pred-Post: 96 %
FEV6-%Pred-Pre: 93 %
FEV6-Post: 3 L
FEV6-Pre: 2.89 L
FEV6FVC-%Change-Post: 0 %
FEV6FVC-%Pred-Post: 103 %
FEV6FVC-%Pred-Pre: 102 %
FVC-%Change-Post: 3 %
FVC-%Pred-Post: 94 %
FVC-%Pred-Pre: 90 %
FVC-Post: 3.03 L
FVC-Pre: 2.91 L
Post FEV1/FVC ratio: 67 %
Post FEV6/FVC ratio: 100 %
Pre FEV1/FVC ratio: 66 %
Pre FEV6/FVC Ratio: 99 %
RV % pred: 106 %
RV: 2.19 L
TLC % pred: 103 %
TLC: 5.22 L

## 2021-12-29 MED ORDER — MONTELUKAST SODIUM 10 MG PO TABS: 10.0000 mg | ORAL_TABLET | Freq: Every day | ORAL | 5 refills | Status: DC

## 2021-12-29 MED ORDER — IPRATROPIUM BROMIDE 0.06 % NA SOLN: 2.0000 | Freq: Two times a day (BID) | NASAL | 5 refills | Status: DC | PRN

## 2021-12-29 MED ORDER — SPACER/AERO-HOLD CHAMBER BAGS MISC: 1.0000 | Freq: Every day | 0 refills | Status: AC | PRN

## 2021-12-29 NOTE — Progress Notes (Unsigned)
Subjective:   PATIENT ID: Cindy Decker GENDER: female DOB: Aug 22, 1960, MRN: [id number]   HPI  No chief complaint on file.   Reason for Visit: Follow-up  Ms. Cindy Decker is a 61 year old female with allergic rhinitis and asthma who presents for follow-up.  Initial consult Previously seen by MR in 2019 for asthma. She was diagnosed with asthma in her early 43s. Previously had bronchitis as a teenager when she smoked. She is Advair Diskus 250-50 mcg once a day. Rarely uses albuterol. She was diagnosed with COVID in January 2023. She reports cough, usually when she wakes up. No nocturnal cough. Associated with chest tightness. Denies shortness of breath. She takes Zyrtec daily. Has rhinorrhea and nasal congestion. Stress related to work. Active at baseline. Walks 1.5 mile daily on treadmill and yoga at night.  12/29/21 Since our last visit she has been taking Advair twice a day. Still having nonproductive cough all day. Denies wheezing or shortness of breath. Has nasal congestion. States she has not changed air filter in >20 years  Social History: Former smoker. Quit in 1987. Started 14. Smoked 12 years x 1.5 ppd. Smoked MJ >40 years ago. Theatre manager x 40 years  Past Medical History:  Diagnosis Date   ALLERGIC RHINITIS    Asthma    Interstitial cystitis      Family History  Problem Relation Age of Onset   Breast cancer Other    Allergies Other    Heart disease Other    COPD Mother      Social History   Occupational History   Occupation: Human resources officer  Tobacco Use   Smoking status: Former    Packs/day: 1.00    Years: 11.00    Total pack years: 11.00    Types: Cigarettes    Quit date: 03/09/1986    Years since quitting: 35.8   Smokeless tobacco: Never  Substance and Sexual Activity   Alcohol use: No   Drug use: Not on file   Sexual activity: Not on file    Allergies  Allergen Reactions   Nitrofurantoin       Outpatient Medications Prior to Visit  Medication Sig Dispense Refill   alendronate (FOSAMAX) 70 MG tablet      Ascorbic Acid (VITAMIN C) 1000 MG tablet Take 1,000 mg by mouth daily.     Calcium Carbonate-Vit D-Min (CALCIUM 1200 PO) Take by mouth.     cetirizine (ZYRTEC) 10 MG tablet Take 10 mg by mouth daily.     cholecalciferol (VITAMIN D) 1000 units tablet Take 1,000 Units by mouth daily.     ferrous sulfate 325 (65 FE) MG tablet Take 325 mg by mouth daily with breakfast.       fluticasone-salmeterol (ADVAIR HFA) 230-21 MCG/ACT inhaler Inhale 2 puffs into the lungs 2 (two) times daily. 1 each 12   montelukast (SINGULAIR) 10 MG tablet Take 1 tablet (10 mg total) by mouth at bedtime. 30 tablet 8   No facility-administered medications prior to visit.    Review of Systems  Constitutional:  Negative for chills, diaphoresis, fever, malaise/fatigue and weight loss.  HENT:  Positive for congestion.   Respiratory:  Positive for cough. Negative for hemoptysis, sputum production, shortness of breath and wheezing.   Cardiovascular:  Positive for chest pain (chest tightness). Negative for palpitations and leg swelling.     Objective:   Vitals:   12/29/21 1609  BP: 100/62  Pulse: 60  SpO2: 97%  Weight: 144 lb (65.3 kg)  Height: 5\' 4"  (1.626 m)   SpO2: 97 % O2 Device: None (Room air)  Physical Exam: General: Well-appearing, no acute distress HENT: [place], AT Eyes: EOMI, no scleral icterus Respiratory: Clear to auscultation bilaterally.  No crackles, wheezing or rales Cardiovascular: RRR, -M/R/G, no JVD Extremities:-Edema,-tenderness Neuro: AAO x4, CNII-XII grossly intact Psych: Normal mood, normal affect  Data Reviewed:  Imaging: CXR 06/21/10 - Mild hyperinflation. No infiltrate, effusion or edema  PFT: 03/19/18 FVC 3.06 (90%) FEV1 2.14 (82%) Ratio 67  TLC 107%  RV 124% DLCO 98% Interpretation: Mild obstructive defect with air trapping  12/29/21 FVC *** (***%) FEV1 ***  (***%) Ratio ***  TLC ***% DLCO ***% Interpretation: ***   Labs: CBC    Component Value Date/Time   WBC 5.1 07/14/2021 1032   RBC 4.30 07/14/2021 1032   HGB 13.6 07/14/2021 1032   HCT 40.9 07/14/2021 1032   PLT 179 07/14/2021 1032   MCV 95.1 07/14/2021 1032   MCH 31.6 07/14/2021 1032   MCHC 33.3 07/14/2021 1032   RDW 12.3 07/14/2021 1032   LYMPHSABS 1,250 07/14/2021 1032   EOSABS 82 07/14/2021 1032   BASOSABS 31 07/14/2021 1032        Assessment & Plan:   Discussion: 61 year old female with allergic rhinitis and asthma who presents as a new consult for asthma. Discussed clinical course and management of asthma including bronchodilator regimen and action plan for exacerbation. Offered singulair however declined. She prefers to on minimal medication unless needed.  Chronic cough Nasal congestion --START Atrovent in the morning. 1-2 sprays per nostril --START Flonase in the evening. 2 sprays per nostril --If cough persistent, order CXR/CT Chest --Consider gabapentin trial in the future  Mild persistent asthma --Reviewed PFTs. Confirmed mild asthma --CONTINUE Advair FA 230-21 TWO puffs TWICE a day. Rinse mouth after use to prevent thrush --ORDER Spacer  --START Singulair 10 mg daily  Health Maintenance  There is no immunization history on file for this patient. CT Lung Screen - Quit >15 years ago  No orders of the defined types were placed in this encounter.  No orders of the defined types were placed in this encounter.   No follow-ups on file.  I have spent a total time of 45-minutes on the day of the appointment reviewing prior documentation, coordinating care and discussing medical diagnosis and plan with the patient/family. Imaging, labs and tests included in this note have been reviewed and interpreted independently by me.  Cindy Decker 62, MD [facility] 12/29/2021 4:42 PM  Office Number [phone]

## 2021-12-29 NOTE — Patient Instructions (Addendum)
Chronic cough Nasal congestion --START Atrovent in the morning. 1-2 sprays per nostril --START Flonase in the evening. 2 sprays per nostril --If cough persistent, order CXR/CT Chest --Consider gabapentin trial in the future  Mild persistent asthma --Reviewed PFTs. Confirmed mild asthma --CONTINUE Advair FA 230-21 TWO puffs TWICE a day. Rinse mouth after use to prevent thrush --ORDER Spacer  --START Singulair 10 mg daily  Follow-up with me in 4 months at [facility] location

## 2022-02-09 DIAGNOSIS — E559 Vitamin D deficiency, unspecified: Secondary | ICD-10-CM | POA: Diagnosis not present

## 2022-02-09 DIAGNOSIS — Z13220 Encounter for screening for lipoid disorders: Secondary | ICD-10-CM | POA: Diagnosis not present

## 2022-02-09 DIAGNOSIS — Z23 Encounter for immunization: Secondary | ICD-10-CM | POA: Diagnosis not present

## 2022-02-09 DIAGNOSIS — Z0000 Encounter for general adult medical examination without abnormal findings: Secondary | ICD-10-CM | POA: Diagnosis not present

## 2022-03-22 DIAGNOSIS — R309 Painful micturition, unspecified: Secondary | ICD-10-CM | POA: Diagnosis not present

## 2022-04-11 DIAGNOSIS — M25512 Pain in left shoulder: Secondary | ICD-10-CM | POA: Diagnosis not present

## 2022-04-16 DIAGNOSIS — M25512 Pain in left shoulder: Secondary | ICD-10-CM | POA: Diagnosis not present

## 2022-05-04 ENCOUNTER — Ambulatory Visit (HOSPITAL_BASED_OUTPATIENT_CLINIC_OR_DEPARTMENT_OTHER): Payer: BC Managed Care – PPO | Admitting: Pulmonary Disease

## 2022-05-18 ENCOUNTER — Encounter (HOSPITAL_BASED_OUTPATIENT_CLINIC_OR_DEPARTMENT_OTHER): Payer: Self-pay | Admitting: Pulmonary Disease

## 2022-05-18 ENCOUNTER — Ambulatory Visit (INDEPENDENT_AMBULATORY_CARE_PROVIDER_SITE_OTHER): Payer: BC Managed Care – PPO | Admitting: Pulmonary Disease

## 2022-05-18 VITALS — BP 112/64 | HR 59 | Ht 64.0 in | Wt 141.2 lb

## 2022-05-18 DIAGNOSIS — J0100 Acute maxillary sinusitis, unspecified: Secondary | ICD-10-CM

## 2022-05-18 DIAGNOSIS — J4530 Mild persistent asthma, uncomplicated: Secondary | ICD-10-CM | POA: Diagnosis not present

## 2022-05-18 MED ORDER — MONTELUKAST SODIUM 10 MG PO TABS: 10.0000 mg | ORAL_TABLET | Freq: Every day | ORAL | 3 refills | Status: AC

## 2022-05-18 MED ORDER — AMOXICILLIN-POT CLAVULANATE 875-125 MG PO TABS: 1.0000 | ORAL_TABLET | Freq: Two times a day (BID) | ORAL | 0 refills | Status: DC

## 2022-05-18 NOTE — Patient Instructions (Signed)
Sinus infection --Augmentin  Chronic cough Nasal congestion --CONTINUE Atrovent in the morning. 1-2 sprays per nostril as needed --CONTINUE Flonase in the evening. 2 sprays per nostril as needed  Mild persistent asthma --Reviewed PFTs. Confirmed mild asthma --CONTINUE Advair HFA 230-21 TWO puffs TWICE a day. Rinse mouth after use to prevent thrush --CONTINUE Singulair 10 mg daily  Follow-up with me in 6 months

## 2022-05-18 NOTE — Progress Notes (Signed)
Subjective:   PATIENT ID: Cindy Decker GENDER: female DOB: [birth year], MRN: [id number]   HPI  Chief Complaint  Patient presents with   Follow-up    Currently has sinus infection not on any meds    Reason for Visit: Follow-up  Ms. Cindy Decker is a 62 year old female with allergic rhinitis and asthma who presents for follow-up.  Initial consult Previously seen by MR in 2019 for asthma. She was diagnosed with asthma in her early 65s. Previously had bronchitis as a teenager when she smoked. She is Advair Diskus 250-50 mcg once a day. Rarely uses albuterol. She was diagnosed with COVID in January 2023. She reports cough, usually when she wakes up. No nocturnal cough. Associated with chest tightness. Denies shortness of breath. She takes Zyrtec daily. Has rhinorrhea and nasal congestion. Stress related to work. Active at baseline. Walks 1.5 mile daily on treadmill and yoga at night.  12/29/21 Since our last visit she has been taking Advair twice a day. Still having nonproductive cough all day. Denies wheezing or shortness of breath. Has nasal congestion. States she has not changed air filter in >20 years and working on that now. She remains active at baseline with daily treadmill and yoga.  05/18/22 She reports five day history of nasal drainage associated with the left facial pain. Reports cough associated with post nasal drainage. Reports headache. Denies fevers, chills. Compliant with Advair HFA. Does not need albuterol. Has chronic unchanged cough. Denies wheezing or shortness of breath.   Asthma Control Test ACT Total Score  05/18/2022 11:03 AM 24  12/29/2021  4:07 PM 22  07/13/2021  4:11 PM 22   Social History: Former smoker. Quit in 1987. Started 14. Smoked 12 years x 1.5 ppd. Smoked MJ >40 years ago. Theatre manager x 40 years  Past Medical History:  Diagnosis Date   ALLERGIC RHINITIS    Asthma    Interstitial cystitis      Family History  Problem Relation Age  of Onset   Breast cancer Other    Allergies Other    Heart disease Other    COPD Mother      Social History   Occupational History   Occupation: Human resources officer  Tobacco Use   Smoking status: Former    Packs/day: 1.00    Years: 11.00    Total pack years: 11.00    Types: Cigarettes    Quit date: 03/09/1986    Years since quitting: 36.2   Smokeless tobacco: Never  Substance and Sexual Activity   Alcohol use: No   Drug use: Not on file   Sexual activity: Not on file    Allergies  Allergen Reactions   Nitrofurantoin      Outpatient Medications Prior to Visit  Medication Sig Dispense Refill   alendronate (FOSAMAX) 70 MG tablet      Ascorbic Acid (VITAMIN C) 1000 MG tablet Take 1,000 mg by mouth daily.     Calcium Carbonate-Vit D-Min (CALCIUM 1200 PO) Take by mouth.     cetirizine (ZYRTEC) 10 MG tablet Take 10 mg by mouth daily.     cholecalciferol (VITAMIN D) 1000 units tablet Take 1,000 Units by mouth daily.     ferrous sulfate 325 (65 FE) MG tablet Take 325 mg by mouth daily with breakfast.       fluticasone-salmeterol (ADVAIR HFA) 230-21 MCG/ACT inhaler Inhale 2 puffs into the lungs 2 (two) times daily. 1 each 12   ipratropium (ATROVENT)  0.06 % nasal spray Place 2 sprays into both nostrils 2 (two) times daily as needed for rhinitis. 15 mL 5   montelukast (SINGULAIR) 10 MG tablet Take 1 tablet (10 mg total) by mouth at bedtime. 30 tablet 5   Spacer/Aero-Hold Chamber Bags MISC 1 each by Does not apply route daily as needed. (Patient not taking: Reported on 05/18/2022) 1 each 0   No facility-administered medications prior to visit.    Review of Systems  Constitutional:  Negative for chills, diaphoresis, fever, malaise/fatigue and weight loss.  HENT:  Positive for congestion.   Respiratory:  Positive for cough. Negative for hemoptysis, sputum production, shortness of breath and wheezing.   Cardiovascular:  Negative for chest pain, palpitations  and leg swelling.     Objective:   Vitals:   05/18/22 1102  BP: 112/64  Pulse: (!) 59  SpO2: 99%  Weight: 141 lb 3.2 oz (64 kg)  Height: 5' 4"$  (1.626 m)   SpO2: 99 % O2 Device: None (Room air)  Physical Exam: General: Well-appearing, no acute distress HENT: [place], AT, left maxillofacial pain Eyes: EOMI, no scleral icterus Respiratory: Clear to auscultation bilaterally.  No crackles, wheezing or rales Cardiovascular: RRR, -M/R/G, no JVD Extremities:-Edema,-tenderness Neuro: AAO x4, CNII-XII grossly intact Psych: Normal mood, normal affect   Data Reviewed:  Imaging: CXR 06/21/10 - Mild hyperinflation. No infiltrate, effusion or edema  PFT: 03/19/18 FVC 3.06 (90%) FEV1 2.14 (82%) Ratio 67  TLC 107%  RV 124% DLCO 98% Interpretation: Mild obstructive defect with air trapping  12/29/21 FVC 3.03 (94%) FEV1 2.04 (82%) Ratio 66  TLC 103% DLCO 114%. Partial bronchodilator response however not significant. Interpretation: Mild obstructive defect is present.   Labs: CBC    Component Value Date/Time   WBC 5.1 07/14/2021 1032   RBC 4.30 07/14/2021 1032   HGB 13.6 07/14/2021 1032   HCT 40.9 07/14/2021 1032   PLT 179 07/14/2021 1032   MCV 95.1 07/14/2021 1032   MCH 31.6 07/14/2021 1032   MCHC 33.3 07/14/2021 1032   RDW 12.3 07/14/2021 1032   LYMPHSABS 1,250 07/14/2021 1032   EOSABS 82 07/14/2021 1032   BASOSABS 31 07/14/2021 1032        Assessment & Plan:   Discussion: 62 year old female with allergic rhinitis and asthma who presents for follow-up. Compliant with inhalers with some chronic cough. Prefers minimal medication unless needed. Presents with acute symptoms consistent with sinus infection  Sinus infection --Augmentin  Chronic cough Nasal congestion --CONTINUE Atrovent in the morning. 1-2 sprays per nostril as needed --CONTINUE Flonase in the evening. 2 sprays per nostril as needed  Mild persistent asthma --Reviewed PFTs. Confirmed mild  asthma --CONTINUE Advair HFA 230-21 TWO puffs TWICE a day. Rinse mouth after use to prevent thrush --CONTINUE Singulair 10 mg daily  Health Maintenance  There is no immunization history on file for this patient. CT Lung Screen - Quit >15 years ago  No orders of the defined types were placed in this encounter.  Meds ordered this encounter  Medications   amoxicillin-clavulanate (AUGMENTIN) 875-125 MG tablet    Sig: Take 1 tablet by mouth 2 (two) times daily.    Dispense:  14 tablet    Refill:  0   montelukast (SINGULAIR) 10 MG tablet    Sig: Take 1 tablet (10 mg total) by mouth at bedtime.    Dispense:  90 tablet    Refill:  3    Return in about 6 months (around 11/16/2022).  I  have spent a total time of 32-minutes on the day of the appointment including chart review, data review, collecting history, coordinating care and discussing medical diagnosis and plan with the patient/family. Past medical history, allergies, medications were reviewed. Pertinent imaging, labs and tests included in this note have been reviewed and interpreted independently by me.  Abdalrahman Clementson Rodman Pickle, MD [facility] Office Number [phone]

## 2022-05-28 ENCOUNTER — Encounter: Payer: Self-pay | Admitting: *Deleted

## 2022-06-22 ENCOUNTER — Ambulatory Visit: Payer: BC Managed Care – PPO | Admitting: Obstetrics and Gynecology

## 2022-08-06 ENCOUNTER — Ambulatory Visit: Payer: BC Managed Care – PPO | Admitting: Obstetrics and Gynecology

## 2022-08-06 ENCOUNTER — Encounter: Payer: Self-pay | Admitting: Obstetrics and Gynecology

## 2022-08-06 VITALS — BP 115/68 | HR 55 | Ht 63.3 in | Wt 140.0 lb

## 2022-08-06 DIAGNOSIS — N393 Stress incontinence (female) (male): Secondary | ICD-10-CM

## 2022-08-06 DIAGNOSIS — R350 Frequency of micturition: Secondary | ICD-10-CM

## 2022-08-06 LAB — POCT URINALYSIS DIPSTICK
Bilirubin, UA: NEGATIVE
Blood, UA: NEGATIVE
Glucose, UA: NEGATIVE
Ketones, UA: NEGATIVE
Leukocytes, UA: NEGATIVE
Nitrite, UA: NEGATIVE
Protein, UA: NEGATIVE
Spec Grav, UA: >=1.03 — AB (ref 1.010–1.025)
Urobilinogen, UA: 0.2 E.U./dL
pH, UA: 5.5 (ref 5.0–8.0)

## 2022-08-06 NOTE — Progress Notes (Signed)
[facility] New Patient Evaluation and Consultation  Referring Provider: Annamaria Helling, MD PCP: Georgann Housekeeper, MD Date of Service: 08/06/2022  SUBJECTIVE Chief Complaint: New Patient (Initial Visit) (Cindy Decker is a 62 y.o. female here for a consult for stress incontinence./)  History of Present Illness: Cindy Decker is a 62 y.o. White or Caucasian female seen in consultation at the request of Dr. Aldona Bar for evaluation of Stress incon.    Review of records significant for: Has previously done pelvic floor PT approximately 2 years ago.  SUI>UUI  Urinary Symptoms: Leaks urine with cough/ sneeze and with a full bladder Leaks 4-5 time(s) per days.  Pad use: 2 liners/ mini-pads per day.   She is bothered by her UI symptoms.  Day time voids 8-10.  Nocturia: 1-2 times per night to void. Voiding dysfunction: she empties her bladder well.  does not use a catheter to empty bladder.  When urinating, she feels she has no difficulties Drinks: 40-60oz Water, 1-2 Cans of Diet coke per day, occasional Tea per day Has a history of IC with prior treatments for bladder pain many years ago, but has not had issues recently. She reports that symptoms are triggered with citrus and she avoids this.   UTIs: 1 UTI's in the last year.   Denies history of blood in urine, kidney or bladder stones, pyelonephritis, bladder cancer, and kidney cancer  Pelvic Organ Prolapse Symptoms:                  She Denies a feeling of a bulge the vaginal area.   Bowel Symptom: Bowel movements: 1-2 time(s) per day Stool consistency: soft  Straining: no.  Splinting: no.  Incomplete evacuation: no.  She Denies accidental bowel leakage / fecal incontinence Bowel regimen: none Last colonoscopy: Date 04/2022, Results no abnormalities  Sexual Function Sexually active: no.  Sexual orientation: Straight Pain with sex: No  Pelvic Pain Denies pelvic pain    Past Medical History:  Past Medical  History:  Diagnosis Date   ALLERGIC RHINITIS    Asthma    Interstitial cystitis      Past Surgical History:   Past Surgical History:  Procedure Laterality Date   BLADDER SURGERY     c-spine fusion for neck fx     CYSTOSCOPY       Past OB/GYN History: G2 P2 Vaginal deliveries: 2,  Forceps/ Vacuum deliveries: 0, Cesarean section: 0 Menopausal: Yes, at age 58 Last pap smear was 05/2021.  Any history of abnormal pap smears: yes.   Medications: She has a current medication list which includes the following prescription(s): alendronate, vitamin c, calcium carbonate-vit d-min, cetirizine, cholecalciferol, ferrous sulfate, fluticasone-salmeterol, montelukast, and spacer/aero-hold chamber bags.   Allergies: Patient is allergic to nitrofurantoin.   Social History:  Social History   Tobacco Use   Smoking status: Former    Packs/day: 1.00    Years: 11.00    Additional pack years: 0.00    Total pack years: 11.00    Types: Cigarettes    Quit date: 03/09/1986    Years since quitting: 36.4   Smokeless tobacco: Never  Vaping Use   Vaping Use: Never used  Substance Use Topics   Alcohol use: Yes    Comment: occationally   Drug use: Never    Relationship status: divorced She lives alone.   She is employed Marine scientist. Regular exercise: Yes: yoga and racquetball History of abuse: No  Family History:   Family History  Problem Relation Age of Onset   COPD Mother    Breast cancer Other    Allergies Other    Heart disease Other      Review of Systems: Review of Systems  Constitutional:  Negative for fever, malaise/fatigue and weight loss.  Respiratory:  Negative for cough, shortness of breath and wheezing.   Cardiovascular:  Negative for chest pain, palpitations and leg swelling.  Gastrointestinal:  Negative for abdominal pain, blood in stool, constipation and diarrhea.  Genitourinary:  Negative for hematuria.  Neurological:  Positive for weakness. Negative for  dizziness and headaches.  Endo/Heme/Allergies:  Does not bruise/bleed easily.  Psychiatric/Behavioral:  Negative for depression and suicidal ideas. The patient is not nervous/anxious.      OBJECTIVE Physical Exam: Vitals:   08/06/22 0821  BP: 115/68  Pulse: (!) 55  Weight: 140 lb (63.5 kg)  Height: 5' 3.3" (1.608 m)    Physical Exam Exam conducted with a chaperone present.  Constitutional:      General: She is not in acute distress.    Appearance: Normal appearance. She is normal weight. She is not ill-appearing.  Pulmonary:     Effort: Pulmonary effort is normal.  Musculoskeletal:     Right lower leg: No edema.     Left lower leg: No edema.  Skin:    General: Skin is warm and dry.  Neurological:     Mental Status: She is alert and oriented to person, place, and time.  Psychiatric:        Mood and Affect: Mood normal.        Behavior: Behavior normal.        Thought Content: Thought content normal.      GU / Detailed Urogynecologic Evaluation:  Pelvic Exam: Normal external female genitalia; Bartholin's and Skene's glands normal in appearance; urethral meatus normal in appearance, no urethral masses or discharge.   CST: positive when standing   Speculum exam reveals normal vaginal mucosa with atrophy. Cervix normal appearance. Uterus normal single, nontender. Adnexa normal adnexa.     Pelvic floor strength II/V  Pelvic floor musculature: Right levator non-tender, Right obturator non-tender, Left levator non-tender, Left obturator non-tender  POP-Q:   POP-Q  -3                                            Aa   -3                                           Ba  -7.5                                              C   1                                            Gh  2.5  Pb  9                                            tvl   -3                                            Ap  -3                                             Bp  -8                                              D      Rectal Exam:  Normal external rectal exam.  Post-Void Residual (PVR) by Bladder Scan: In order to evaluate bladder emptying, we discussed obtaining a postvoid residual and she agreed to this procedure.  Procedure: The ultrasound unit was placed on the patient's abdomen in the suprapubic region after the patient had voided. A PVR of 0 ml was obtained by bladder scan.  Laboratory Results: POC urine: negative   ASSESSMENT AND PLAN Ms. Kendzierski is a 62 y.o. with:  1. SUI (stress urinary incontinence, female)   2. Urinary frequency    SUI - CST positive today on exam. Options discussed with patient including midurethral mesh sling, urethral bulking, and pessary. Patient is interested in surgical placement of midurethral sling. Will plan for placement of Midurethral Sling with Cystoscopy. .  General Surgical Risks: For all procedures, there are risks of bleeding, infection, damage to surrounding organs including but not limited to bowel, bladder, blood vessels, ureters and nerves, and need for further surgery if an injury were to occur. These risks are all low with minimally invasive surgery.   There are risks of numbness and weakness at any body site or buttock/rectal pain.  It is possible that baseline pain can be worsened by surgery, either with or without mesh. If surgery is vaginal, there is also a low risk of possible conversion to laparoscopy or open abdominal incision where indicated. Very rare risks include blood transfusion, blood clot, heart attack, pneumonia, or death.   There is also a risk of short-term postoperative urinary retention with need to use a catheter. About half of patients need to go home from surgery with a catheter, which is then later removed in the office. The risk of long-term need for a catheter is very low. There is also a risk of worsening of overactive bladder.   Sling: The effectiveness of a  midurethral vaginal mesh sling is approximately 85%, and thus, there will be times when you may leak urine after surgery, especially if your bladder is full or if you have a strong cough. There is a balance between making the sling tight enough to treat your leakage but not too tight so that you have long-term difficulty emptying your bladder. A mesh sling will not directly treat overactive bladder/urge incontinence and may worsen it.  There is an FDA safety notification on vaginal mesh procedures for prolapse but NOT  mesh slings. We have extensive experience and training with mesh placement and we have close postoperative follow up to identify any potential complications from mesh. It is important to realize that this mesh is a permanent implant that cannot be easily removed. There are rare risks of mesh exposure (2-4%), pain with intercourse (0-7%), and infection (<1%). The risk of mesh exposure if more likely in a woman with risks for poor healing (prior radiation, poorly controlled diabetes, or immunocompromised). The risk of new or worsened chronic pain after mesh implant is more common in women with baseline chronic pain and/or poorly controlled anxiety or depression. Approximately 2-4% of patients will experience longer-term post-operative voiding dysfunction that may require surgical revision of the sling. We also reviewed that postoperatively, her stream may not be as strong as before surgery.    Urinary frequency - not bothersome for her - Discussed decreasing bladder irritants such as tea, soda, and citrus.   Request sent for surgery scheduling. Will return for pre op.   Marguerita Beards, MD

## 2022-09-04 DIAGNOSIS — L821 Other seborrheic keratosis: Secondary | ICD-10-CM | POA: Diagnosis not present

## 2022-09-04 DIAGNOSIS — L570 Actinic keratosis: Secondary | ICD-10-CM | POA: Diagnosis not present

## 2022-09-04 DIAGNOSIS — C44612 Basal cell carcinoma of skin of right upper limb, including shoulder: Secondary | ICD-10-CM | POA: Diagnosis not present

## 2022-09-04 DIAGNOSIS — X32XXXD Exposure to sunlight, subsequent encounter: Secondary | ICD-10-CM | POA: Diagnosis not present

## 2022-10-08 DIAGNOSIS — H2513 Age-related nuclear cataract, bilateral: Secondary | ICD-10-CM | POA: Diagnosis not present

## 2022-10-08 DIAGNOSIS — H40033 Anatomical narrow angle, bilateral: Secondary | ICD-10-CM | POA: Diagnosis not present

## 2022-10-08 DIAGNOSIS — H43811 Vitreous degeneration, right eye: Secondary | ICD-10-CM | POA: Diagnosis not present

## 2022-10-08 DIAGNOSIS — H43812 Vitreous degeneration, left eye: Secondary | ICD-10-CM | POA: Diagnosis not present

## 2022-10-08 HISTORY — PX: COLONOSCOPY WITH PROPOFOL: SHX5780

## 2022-11-09 DIAGNOSIS — Z85828 Personal history of other malignant neoplasm of skin: Secondary | ICD-10-CM | POA: Diagnosis not present

## 2022-11-09 DIAGNOSIS — L570 Actinic keratosis: Secondary | ICD-10-CM | POA: Diagnosis not present

## 2022-11-09 DIAGNOSIS — Z08 Encounter for follow-up examination after completed treatment for malignant neoplasm: Secondary | ICD-10-CM | POA: Diagnosis not present

## 2022-11-09 DIAGNOSIS — X32XXXD Exposure to sunlight, subsequent encounter: Secondary | ICD-10-CM | POA: Diagnosis not present

## 2022-11-23 ENCOUNTER — Encounter (HOSPITAL_BASED_OUTPATIENT_CLINIC_OR_DEPARTMENT_OTHER): Payer: Self-pay | Admitting: Pulmonary Disease

## 2022-11-23 ENCOUNTER — Ambulatory Visit (HOSPITAL_BASED_OUTPATIENT_CLINIC_OR_DEPARTMENT_OTHER): Payer: BC Managed Care – PPO | Admitting: Pulmonary Disease

## 2022-11-23 VITALS — BP 120/68 | HR 61 | Ht 63.0 in | Wt 147.0 lb

## 2022-11-23 DIAGNOSIS — J4530 Mild persistent asthma, uncomplicated: Secondary | ICD-10-CM | POA: Diagnosis not present

## 2022-11-23 NOTE — Progress Notes (Signed)
Subjective:   PATIENT ID: Cindy Decker GENDER: female DOB: 06-23-1960, MRN: [id number]   HPI  Chief Complaint  Patient presents with   Asthma    Reason for Visit: Follow-up  Ms. Cindy Decker is a 62 year old female with allergic rhinitis and asthma who presents for follow-up.  Initial consult Previously seen by MR in 2019 for asthma. She was diagnosed with asthma in her early 59s. Previously had bronchitis as a teenager when she smoked. She is Advair Diskus 250-50 mcg once a day. Rarely uses albuterol. She was diagnosed with COVID in January 2023. She reports cough, usually when she wakes up. No nocturnal cough. Associated with chest tightness. Denies shortness of breath. She takes Zyrtec daily. Has rhinorrhea and nasal congestion. Stress related to work. Active at baseline. Walks 1.5 mile daily on treadmill and yoga at night.  12/29/21 Since our last visit she has been taking Advair twice a day. Still having nonproductive cough all day. Denies wheezing or shortness of breath. Has nasal congestion. States she has not changed air filter in >20 years and working on that now. She remains active at baseline with daily treadmill and yoga.  05/18/22 She reports five day history of nasal drainage associated with the left facial pain. Reports cough associated with post nasal drainage. Reports headache. Denies fevers, chills. Compliant with Advair HFA. Does not need albuterol. Has chronic unchanged cough. Denies wheezing or shortness of breath.   11/23/22 Since our last visit she switched from Advair HFA to generic Advair diskus. She rarely uses albuterol. Denies shortness of breath, cough or wheezing. No exacerbations except sinus infection in Feb.  Asthma Control Test ACT Total Score  11/23/2022  2:13 PM 25  05/18/2022 11:03 AM 24  12/29/2021  4:07 PM 22   Social History: Former smoker. Quit in 1987. Started 14. Smoked 12 years x 1.5 ppd. Smoked MJ >40 years ago. Marine scientist  x 40 years  Past Medical History:  Diagnosis Date   ALLERGIC RHINITIS    Asthma    Interstitial cystitis      Family History  Problem Relation Age of Onset   COPD Mother    Breast cancer Other    Allergies Other    Heart disease Other      Social History   Occupational History   Occupation: Software engineer  Tobacco Use   Smoking status: Former    Current packs/day: 0.00    Average packs/day: 1 pack/day for 11.0 years (11.0 ttl pk-yrs)    Types: Cigarettes    Start date: 03/10/1975    Quit date: 03/09/1986    Years since quitting: 36.7   Smokeless tobacco: Never  Vaping Use   Vaping status: Never Used  Substance and Sexual Activity   Alcohol use: Yes    Comment: occationally   Drug use: Never   Sexual activity: Not Currently    Allergies  Allergen Reactions   Nitrofurantoin      Outpatient Medications Prior to Visit  Medication Sig Dispense Refill   alendronate (FOSAMAX) 70 MG tablet      Ascorbic Acid (VITAMIN C) 1000 MG tablet Take 1,000 mg by mouth daily.     Calcium Carbonate-Vit D-Min (CALCIUM 1200 PO) Take by mouth.     cetirizine (ZYRTEC) 10 MG tablet Take 10 mg by mouth daily.     cholecalciferol (VITAMIN D) 1000 units tablet Take 1,000 Units by mouth daily.     ferrous sulfate 325 (  65 FE) MG tablet Take 325 mg by mouth daily with breakfast.       fluticasone-salmeterol (ADVAIR) 250-50 MCG/ACT AEPB Inhale 1 puff into the lungs every 12 (twelve) hours.     montelukast (SINGULAIR) 10 MG tablet Take 1 tablet (10 mg total) by mouth at bedtime. 90 tablet 3   Spacer/Aero-Hold Chamber Bags MISC 1 each by Does not apply route daily as needed. 1 each 0   fluticasone-salmeterol (ADVAIR HFA) 230-21 MCG/ACT inhaler Inhale 2 puffs into the lungs 2 (two) times daily. 1 each 12   No facility-administered medications prior to visit.    Review of Systems  Constitutional:  Negative for chills, diaphoresis, fever, malaise/fatigue and weight  loss.  HENT:  Negative for congestion.   Respiratory:  Negative for cough, hemoptysis, sputum production, shortness of breath and wheezing.   Cardiovascular:  Negative for chest pain, palpitations and leg swelling.     Objective:   Vitals:   11/23/22 1404  BP: 120/68  Pulse: 61  SpO2: 99%  Weight: 147 lb (66.7 kg)  Height: 5\' 3"  (1.6 m)    SpO2: 99 % Physical Exam: General: Well-appearing, no acute distress HENT: [place], AT Eyes: EOMI, no scleral icterus Respiratory: Clear to auscultation bilaterally.  No crackles, wheezing or rales Cardiovascular: RRR, -M/R/G, no JVD Extremities:-Edema,-tenderness Neuro: AAO x4, CNII-XII grossly intact Psych: Normal mood, normal affect  Data Reviewed:  Imaging: CXR 06/21/10 - Mild hyperinflation. No infiltrate, effusion or edema  PFT: 03/19/18 FVC 3.06 (90%) FEV1 2.14 (82%) Ratio 67  TLC 107%  RV 124% DLCO 98% Interpretation: Mild obstructive defect with air trapping  12/29/21 FVC 3.03 (94%) FEV1 2.04 (82%) Ratio 66  TLC 103% DLCO 114%. Partial bronchodilator response however not significant. Interpretation: Mild obstructive defect is present.   Labs: CBC    Component Value Date/Time   WBC 5.1 07/14/2021 1032   RBC 4.30 07/14/2021 1032   HGB 13.6 07/14/2021 1032   HCT 40.9 07/14/2021 1032   PLT 179 07/14/2021 1032   MCV 95.1 07/14/2021 1032   MCH 31.6 07/14/2021 1032   MCHC 33.3 07/14/2021 1032   RDW 12.3 07/14/2021 1032   LYMPHSABS 1,250 07/14/2021 1032   EOSABS 82 07/14/2021 1032   BASOSABS 31 07/14/2021 1032        Assessment & Plan:   Discussion: 62 year old female with allergic rhinitis and asthma who presents for follow-up. Well controlled. Prefers minimal medication unless needed  Chronic cough Nasal congestion --CONTINUE Atrovent in the morning. 1-2 sprays per nostril as needed --CONTINUE Flonase in the evening. 2 sprays per nostril as needed  Mild persistent asthma --CONTINUE generic Advair Diskus 250-50  ONE puffs TWICE a day. Rinse mouth after use to prevent thrush --CONTINUE Singulair 10 mg daily  Health Maintenance  There is no immunization history on file for this patient. CT Lung Screen - Quit >15 years ago  No orders of the defined types were placed in this encounter.  No orders of the defined types were placed in this encounter.   Return in about 1 year (around 11/23/2023).  I have spent a total time of 20-minutes on the day of the appointment including chart review, data review, collecting history, coordinating care and discussing medical diagnosis and plan with the patient/family. Past medical history, allergies, medications were reviewed. Pertinent imaging, labs and tests included in this note have been reviewed and interpreted independently by me.  Maricela Schreur Mechele Collin, MD [facility] Office Number [phone]

## 2022-11-23 NOTE — Patient Instructions (Signed)
Mild persistent asthma --CONTINUE generic Advair Diskus 250-50 ONE puffs TWICE a day. Rinse mouth after use to prevent thrush --CONTINUE Singulair 10 mg daily

## 2022-12-21 ENCOUNTER — Ambulatory Visit (INDEPENDENT_AMBULATORY_CARE_PROVIDER_SITE_OTHER): Payer: BC Managed Care – PPO | Admitting: Obstetrics and Gynecology

## 2022-12-21 ENCOUNTER — Encounter: Payer: Self-pay | Admitting: Obstetrics and Gynecology

## 2022-12-21 VITALS — BP 124/62 | HR 52 | Wt 145.4 lb

## 2022-12-21 DIAGNOSIS — Z01818 Encounter for other preprocedural examination: Secondary | ICD-10-CM

## 2022-12-21 MED ORDER — ACETAMINOPHEN 500 MG PO TABS: 500.0000 mg | ORAL_TABLET | Freq: Four times a day (QID) | ORAL | 0 refills | Status: AC | PRN

## 2022-12-21 MED ORDER — TRAMADOL HCL 50 MG PO TABS: 50.0000 mg | ORAL_TABLET | Freq: Three times a day (TID) | ORAL | 0 refills | Status: AC | PRN

## 2022-12-21 MED ORDER — IBUPROFEN 600 MG PO TABS: 600.0000 mg | ORAL_TABLET | Freq: Four times a day (QID) | ORAL | 0 refills | Status: DC | PRN

## 2022-12-21 MED ORDER — POLYETHYLENE GLYCOL 3350 17 GM/SCOOP PO POWD: 17.0000 g | Freq: Every day | ORAL | 0 refills | Status: DC

## 2022-12-21 NOTE — H&P (Signed)
[facility] H&P  Subjective Chief Complaint: Cindy Decker presents for a preoperative encounter.   History of Present Illness: Cindy Decker is a 62 y.o. female who presents for preoperative visit.  She is scheduled to undergo Exam under anesthesia, Midurethral Sling with Cystoscopy on 01/07/23.  Her symptoms include Stress urinary incontinence, and she was was found to have Stage 0 anterior, Stage 0 posterior, Stage I/IV apical prolapse.   Urodynamics showed: Deferred CST positive on exam  Past Medical History:  Diagnosis Date   ALLERGIC RHINITIS    Asthma    Interstitial cystitis      Past Surgical History:  Procedure Laterality Date   BLADDER SURGERY     c-spine fusion for neck fx     CYSTOSCOPY      is allergic to nitrofurantoin.   Family History  Problem Relation Age of Onset   COPD Mother    Breast cancer Other    Allergies Other    Heart disease Other     Social History   Tobacco Use   Smoking status: Former    Current packs/day: 0.00    Average packs/day: 1 pack/day for 11.0 years (11.0 ttl pk-yrs)    Types: Cigarettes    Start date: 03/10/1975    Quit date: 03/09/1986    Years since quitting: 36.8   Smokeless tobacco: Never  Vaping Use   Vaping status: Never Used  Substance Use Topics   Alcohol use: Yes    Comment: occationally   Drug use: Never     Review of Systems was negative for a full 10 system review except as noted in the History of Present Illness.  No current facility-administered medications for this encounter.  Current Outpatient Medications:    acetaminophen (TYLENOL) 500 MG tablet, Take 1 tablet (500 mg total) by mouth every 6 (six) hours as needed (pain)., Disp: 30 tablet, Rfl: 0   alendronate (FOSAMAX) 70 MG tablet, , Disp: , Rfl:    Ascorbic Acid (VITAMIN C) 1000 MG tablet, Take 1,000 mg by mouth daily., Disp: , Rfl:    Calcium Carbonate-Vit D-Min (CALCIUM 1200 PO), Take by mouth., Disp: , Rfl:    cetirizine  (ZYRTEC) 10 MG tablet, Take 10 mg by mouth daily., Disp: , Rfl:    cholecalciferol (VITAMIN D) 1000 units tablet, Take 1,000 Units by mouth daily., Disp: , Rfl:    ferrous sulfate 325 (65 FE) MG tablet, Take 325 mg by mouth daily with breakfast.  , Disp: , Rfl:    fluticasone-salmeterol (ADVAIR) 250-50 MCG/ACT AEPB, Inhale 1 puff into the lungs every 12 (twelve) hours., Disp: , Rfl:    ibuprofen (ADVIL) 600 MG tablet, Take 1 tablet (600 mg total) by mouth every 6 (six) hours as needed., Disp: 30 tablet, Rfl: 0   montelukast (SINGULAIR) 10 MG tablet, Take 1 tablet (10 mg total) by mouth at bedtime., Disp: 90 tablet, Rfl: 3   polyethylene glycol powder (GLYCOLAX/MIRALAX) 17 GM/SCOOP powder, Take 17 g by mouth daily. Drink 17g (1 scoop) dissolved in water per day., Disp: 255 g, Rfl: 0   Spacer/Aero-Hold Chamber Bags MISC, 1 each by Does not apply route daily as needed., Disp: 1 each, Rfl: 0   traMADol (ULTRAM) 50 MG tablet, Take 1 tablet (50 mg total) by mouth every 8 (eight) hours as needed for up to 5 days., Disp: 5 tablet, Rfl: 0   Objective There were no vitals filed for this visit.   Gen: NAD CV: S1  S2 RRR Lungs: Clear to auscultation bilaterally Abd: soft, nontender   Previous Pelvic Exam showed: Normal external female genitalia; Bartholin's and Skene's glands normal in appearance; urethral meatus normal in appearance, no urethral masses or discharge.    CST: positive when standing    Speculum exam reveals normal vaginal mucosa with atrophy. Cervix normal appearance. Uterus normal single, nontender. Adnexa normal adnexa.       Pelvic floor strength II/V   Pelvic floor musculature: Right levator non-tender, Right obturator non-tender, Left levator non-tender, Left obturator non-tender   POP-Q:    POP-Q   -3                                            Aa   -3                                           Ba   -7.5                                              C    1                                             Gh   2.5                                            Pb   9                                            tvl    -3                                            Ap   -3                                            Bp   -8                                              D          Rectal Exam:  Normal external rectal exam.    Assessment/ Plan  Assessment: The patient is a 62 y.o. year old scheduled to undergo Exam under anesthesia, Midurethral Sling with Cystoscopy. Verbal consent was obtained for these procedures.

## 2022-12-21 NOTE — Progress Notes (Signed)
[facility] Pre-Operative Exam  Subjective Chief Complaint: [name] presents for a preoperative encounter.   History of Present Illness: Cindy Decker is a 62 y.o. female who presents for preoperative visit.  She is scheduled to undergo Exam under anesthesia, Midurethral Sling with Cystoscopy on 01/07/23.  Her symptoms include Stress urinary incontinence, and she was was found to have Stage 0 anterior, Stage 0 posterior, Stage I/IV apical prolapse.   Urodynamics showed: Deferred CST positive on exam  Past Medical History:  Diagnosis Date   ALLERGIC RHINITIS    Asthma    Interstitial cystitis      Past Surgical History:  Procedure Laterality Date   BLADDER SURGERY     c-spine fusion for neck fx     CYSTOSCOPY      is allergic to nitrofurantoin.   Family History  Problem Relation Age of Onset   COPD Mother    Breast cancer Other    Allergies Other    Heart disease Other     Social History   Tobacco Use   Smoking status: Former    Current packs/day: 0.00    Average packs/day: 1 pack/day for 11.0 years (11.0 ttl pk-yrs)    Types: Cigarettes    Start date: 03/10/1975    Quit date: 03/09/1986    Years since quitting: 36.8   Smokeless tobacco: Never  Vaping Use   Vaping status: Never Used  Substance Use Topics   Alcohol use: Yes    Comment: occationally   Drug use: Never     Review of Systems was negative for a full 10 system review except as noted in the History of Present Illness.   Current Outpatient Medications:    alendronate (FOSAMAX) 70 MG tablet, , Disp: , Rfl:    Ascorbic Acid (VITAMIN C) 1000 MG tablet, Take 1,000 mg by mouth daily., Disp: , Rfl:    Calcium Carbonate-Vit D-Min (CALCIUM 1200 PO), Take by mouth., Disp: , Rfl:    cetirizine (ZYRTEC) 10 MG tablet, Take 10 mg by mouth daily., Disp: , Rfl:    cholecalciferol (VITAMIN D) 1000 units tablet, Take 1,000 Units by mouth daily., Disp: , Rfl:    ferrous sulfate 325 (65 FE) MG  tablet, Take 325 mg by mouth daily with breakfast.  , Disp: , Rfl:    fluticasone-salmeterol (ADVAIR) 250-50 MCG/ACT AEPB, Inhale 1 puff into the lungs every 12 (twelve) hours., Disp: , Rfl:    montelukast (SINGULAIR) 10 MG tablet, Take 1 tablet (10 mg total) by mouth at bedtime., Disp: 90 tablet, Rfl: 3   Spacer/Aero-Hold Chamber Bags MISC, 1 each by Does not apply route daily as needed., Disp: 1 each, Rfl: 0   Objective Vitals:   12/21/22 1121  BP: 124/62  Pulse: (!) 52    Gen: NAD CV: S1 S2 RRR Lungs: Clear to auscultation bilaterally Abd: soft, nontender   Previous Pelvic Exam showed: Normal external female genitalia; Bartholin's and Skene's glands normal in appearance; urethral meatus normal in appearance, no urethral masses or discharge.    CST: positive when standing    Speculum exam reveals normal vaginal mucosa with atrophy. Cervix normal appearance. Uterus normal single, nontender. Adnexa normal adnexa.       Pelvic floor strength II/V   Pelvic floor musculature: Right levator non-tender, Right obturator non-tender, Left levator non-tender, Left obturator non-tender   POP-Q:    POP-Q   -3  Aa   -3                                           Ba   -7.5                                              C    1                                            Gh   2.5                                            Pb   9                                            tvl    -3                                            Ap   -3                                            Bp   -8                                              D          Rectal Exam:  Normal external rectal exam.    Assessment/ Plan  Assessment: The patient is a 62 y.o. year old scheduled to undergo Exam under anesthesia, Midurethral Sling with Cystoscopy. Verbal consent was obtained for these procedures.  Plan: General Surgical Consent: The patient has  previously been counseled on alternative treatments, and the decision by the patient and provider was to proceed with the procedure listed above.  For all procedures, there are risks of bleeding, infection, damage to surrounding organs including but not limited to bowel, bladder, blood vessels, ureters and nerves, and need for further surgery if an injury were to occur. These risks are all low with minimally invasive surgery.   There are risks of numbness and weakness at any body site or buttock/rectal pain.  It is possible that baseline pain can be worsened by surgery, either with or without mesh. If surgery is vaginal, there is also a low risk of possible conversion to laparoscopy or open abdominal incision where indicated. Very rare risks include blood transfusion, blood clot, heart attack, pneumonia, or death.   There is also a risk of short-term postoperative urinary retention with need to use a catheter. About half of patients need to go home  from surgery with a catheter, which is then later removed in the office. The risk of long-term need for a catheter is very low. There is also a risk of worsening of overactive bladder.   Sling: The effectiveness of a midurethral vaginal mesh sling is approximately 85%, and thus, there will be times when you may leak urine after surgery, especially if your bladder is full or if you have a strong cough. There is a balance between making the sling tight enough to treat your leakage but not too tight so that you have long-term difficulty emptying your bladder. A mesh sling will not directly treat overactive bladder/urge incontinence and may worsen it.  There is an FDA safety notification on vaginal mesh procedures for prolapse but NOT mesh slings. We have extensive experience and training with mesh placement and we have close postoperative follow up to identify any potential complications from mesh. It is important to realize that this mesh is a permanent implant  that cannot be easily removed. There are rare risks of mesh exposure (2-4%), pain with intercourse (0-7%), and infection (<1%). The risk of mesh exposure if more likely in a woman with risks for poor healing (prior radiation, poorly controlled diabetes, or immunocompromised). The risk of new or worsened chronic pain after mesh implant is more common in women with baseline chronic pain and/or poorly controlled anxiety or depression. Approximately 2-4% of patients will experience longer-term post-operative voiding dysfunction that may require surgical revision of the sling. We also reviewed that postoperatively, her stream may not be as strong as before surgery.    We discussed consent for blood products. Risks for blood transfusion include allergic reactions, other reactions that can affect different body organs and managed accordingly, transmission of infectious diseases such as HIV or Hepatitis. However, the blood is screened. Patient consents for blood products.  Pre-operative instructions:  She was instructed to not take Aspirin/NSAIDs x 7days prior to surgery.  Antibiotic prophylaxis was ordered as indicated.  Catheter use: Patient will go home with foley if needed after post-operative voiding trial.  Post-operative instructions:  She was provided with specific post-operative instructions, including precautions and signs/symptoms for which we would recommend contacting us, in addition to daytime and after-hours contact phone numbers. This was provided on a handout.   Post-operative medications: Prescriptions for motrin, tylenol, miralax, and tramadol were sent to her pharmacy. Discussed using ibuprofen and tylenol on a schedule to limit use of narcotics.   Laboratory testing:  We will check labs: As requested by anesthesia  Preoperative clearance:  She does not require surgical clearance.    Post-operative follow-up:  A post-operative appointment will be made for 6 weeks from the date of  surgery. If she needs a post-operative nurse visit for a voiding trial, that will be set up after she leaves the hospital.    Patient will call the clinic or use MyChart should anything change or any new issues arise.   Selmer Dominion, NP

## 2022-12-31 ENCOUNTER — Encounter (HOSPITAL_BASED_OUTPATIENT_CLINIC_OR_DEPARTMENT_OTHER): Payer: Self-pay | Admitting: Obstetrics and Gynecology

## 2022-12-31 NOTE — Progress Notes (Signed)
Spoke w/ via phone for pre-op interview--- pt Lab needs dos---- no        Lab results------ no COVID test -----patient states asymptomatic no test needed Arrive at ------- 0930 on 01-07-2023 NPO after MN NO Solid Food.  Clear liquids from MN until--- 0830 Med rec completed Medications to take morning of surgery ----- zyrtec, advair inhaler Diabetic medication ----- n/a Patient instructed no nail polish to be worn day of surgery Patient instructed to bring photo id and insurance card day of surgery Patient aware to have Driver (ride ) / caregiver    for 24 hours after surgery - daughter, Cindy Decker Patient Special Instructions ----- asked to bring rescue inhaler dos Pre-Op special Instructions ----- n/a Patient verbalized understanding of instructions that were given at this phone interview. Patient denies chest pain, sob, fever, cough at the interview.

## 2023-01-07 ENCOUNTER — Other Ambulatory Visit: Payer: Self-pay

## 2023-01-07 ENCOUNTER — Telehealth: Payer: Self-pay | Admitting: Obstetrics and Gynecology

## 2023-01-07 ENCOUNTER — Encounter (HOSPITAL_BASED_OUTPATIENT_CLINIC_OR_DEPARTMENT_OTHER)
Admission: RE | Disposition: A | Discharge status: Home / Self Care | Payer: Self-pay | Source: Home / Self Care | Attending: Obstetrics and Gynecology

## 2023-01-07 ENCOUNTER — Encounter (HOSPITAL_BASED_OUTPATIENT_CLINIC_OR_DEPARTMENT_OTHER): Payer: Self-pay | Admitting: Obstetrics and Gynecology

## 2023-01-07 ENCOUNTER — Ambulatory Visit (HOSPITAL_BASED_OUTPATIENT_CLINIC_OR_DEPARTMENT_OTHER)
Admission: RE | Admit: 2023-01-07 | Discharge: 2023-01-07 | Disposition: A | Discharge status: Home / Self Care | Payer: BC Managed Care – PPO | Attending: Obstetrics and Gynecology | Admitting: Obstetrics and Gynecology

## 2023-01-07 ENCOUNTER — Ambulatory Visit (HOSPITAL_BASED_OUTPATIENT_CLINIC_OR_DEPARTMENT_OTHER): Payer: BC Managed Care – PPO | Admitting: Anesthesiology

## 2023-01-07 DIAGNOSIS — J45909 Unspecified asthma, uncomplicated: Secondary | ICD-10-CM | POA: Insufficient documentation

## 2023-01-07 DIAGNOSIS — Z87891 Personal history of nicotine dependence: Secondary | ICD-10-CM | POA: Diagnosis not present

## 2023-01-07 DIAGNOSIS — N393 Stress incontinence (female) (male): Secondary | ICD-10-CM | POA: Diagnosis not present

## 2023-01-07 DIAGNOSIS — Z01818 Encounter for other preprocedural examination: Secondary | ICD-10-CM

## 2023-01-07 DIAGNOSIS — Z7951 Long term (current) use of inhaled steroids: Secondary | ICD-10-CM | POA: Diagnosis not present

## 2023-01-07 HISTORY — DX: Gastro-esophageal reflux disease without esophagitis: K21.9

## 2023-01-07 HISTORY — DX: Presence of spectacles and contact lenses: Z97.3

## 2023-01-07 HISTORY — DX: Unspecified osteoarthritis, unspecified site: M19.90

## 2023-01-07 HISTORY — DX: Iron deficiency: E61.1

## 2023-01-07 HISTORY — DX: Mild persistent asthma, uncomplicated: J45.30

## 2023-01-07 HISTORY — PX: BLADDER SUSPENSION: SHX72

## 2023-01-07 HISTORY — PX: CYSTOSCOPY: SHX5120

## 2023-01-07 HISTORY — DX: Stress incontinence (female) (male): N39.3

## 2023-01-07 SURGERY — URETHROPEXY, USING TRANSVAGINAL TAPE
Anesthesia: General | Site: Vagina

## 2023-01-07 MED ORDER — LIDOCAINE HCL (PF) 2 % IJ SOLN
INTRAMUSCULAR | Status: AC
  Filled 2023-01-07: qty 5

## 2023-01-07 MED ORDER — LACTATED RINGERS IV SOLN: INTRAVENOUS | Status: DC

## 2023-01-07 MED ORDER — ACETAMINOPHEN 500 MG PO TABS
1000.0000 mg | ORAL_TABLET | ORAL | Status: AC
  Administered 2023-01-07: 1000 mg via ORAL

## 2023-01-07 MED ORDER — KETOROLAC TROMETHAMINE 30 MG/ML IJ SOLN
INTRAMUSCULAR | Status: AC
  Filled 2023-01-07: qty 1

## 2023-01-07 MED ORDER — KETOROLAC TROMETHAMINE 30 MG/ML IJ SOLN
INTRAMUSCULAR | Status: DC | PRN
  Administered 2023-01-07: 30 mg via INTRAVENOUS

## 2023-01-07 MED ORDER — 0.9 % SODIUM CHLORIDE (POUR BTL) OPTIME
TOPICAL | Status: DC | PRN
Start: 2023-01-07 — End: 2023-01-07
  Administered 2023-01-07: 500 mL

## 2023-01-07 MED ORDER — DEXAMETHASONE SODIUM PHOSPHATE 10 MG/ML IJ SOLN
INTRAMUSCULAR | Status: AC
  Filled 2023-01-07: qty 1

## 2023-01-07 MED ORDER — PROPOFOL 10 MG/ML IV BOLUS
INTRAVENOUS | Status: DC | PRN
  Administered 2023-01-07: 150 mg via INTRAVENOUS

## 2023-01-07 MED ORDER — PROMETHAZINE HCL 25 MG/ML IJ SOLN: 6.2500 mg | INTRAMUSCULAR | Status: DC | PRN

## 2023-01-07 MED ORDER — CEFAZOLIN SODIUM-DEXTROSE 2-4 GM/100ML-% IV SOLN
INTRAVENOUS | Status: AC
  Filled 2023-01-07: qty 100

## 2023-01-07 MED ORDER — GABAPENTIN 300 MG PO CAPS
300.0000 mg | ORAL_CAPSULE | ORAL | Status: AC
  Administered 2023-01-07: 300 mg via ORAL

## 2023-01-07 MED ORDER — ONDANSETRON HCL 4 MG/2ML IJ SOLN
INTRAMUSCULAR | Status: AC
  Filled 2023-01-07: qty 2

## 2023-01-07 MED ORDER — DEXAMETHASONE SODIUM PHOSPHATE 10 MG/ML IJ SOLN
INTRAMUSCULAR | Status: DC | PRN
  Administered 2023-01-07: 10 mg via INTRAVENOUS

## 2023-01-07 MED ORDER — MIDAZOLAM HCL 5 MG/5ML IJ SOLN
INTRAMUSCULAR | Status: DC | PRN
  Administered 2023-01-07: 2 mg via INTRAVENOUS

## 2023-01-07 MED ORDER — PHENAZOPYRIDINE HCL 100 MG PO TABS
ORAL_TABLET | ORAL | Status: AC
  Filled 2023-01-07: qty 2

## 2023-01-07 MED ORDER — PROPOFOL 10 MG/ML IV BOLUS
INTRAVENOUS | Status: AC
  Filled 2023-01-07: qty 20

## 2023-01-07 MED ORDER — LIDOCAINE-EPINEPHRINE 1 %-1:100000 IJ SOLN
INTRAMUSCULAR | Status: DC | PRN
  Administered 2023-01-07: 10 mL

## 2023-01-07 MED ORDER — FENTANYL CITRATE (PF) 100 MCG/2ML IJ SOLN: 25.0000 ug | INTRAMUSCULAR | Status: DC | PRN

## 2023-01-07 MED ORDER — PHENAZOPYRIDINE HCL 100 MG PO TABS
200.0000 mg | ORAL_TABLET | ORAL | Status: AC
  Administered 2023-01-07: 200 mg via ORAL

## 2023-01-07 MED ORDER — FENTANYL CITRATE (PF) 100 MCG/2ML IJ SOLN
INTRAMUSCULAR | Status: AC
  Filled 2023-01-07: qty 2

## 2023-01-07 MED ORDER — KETOROLAC TROMETHAMINE 30 MG/ML IJ SOLN: 30.0000 mg | Freq: Once | INTRAMUSCULAR | Status: DC | PRN

## 2023-01-07 MED ORDER — OXYCODONE HCL 5 MG/5ML PO SOLN: 5.0000 mg | Freq: Once | ORAL | Status: DC | PRN

## 2023-01-07 MED ORDER — GABAPENTIN 300 MG PO CAPS
ORAL_CAPSULE | ORAL | Status: AC
  Filled 2023-01-07: qty 1

## 2023-01-07 MED ORDER — SODIUM CHLORIDE 0.9 % IR SOLN
Status: DC | PRN
  Administered 2023-01-07: 1000 mL

## 2023-01-07 MED ORDER — MIDAZOLAM HCL 2 MG/2ML IJ SOLN
INTRAMUSCULAR | Status: AC
  Filled 2023-01-07: qty 2

## 2023-01-07 MED ORDER — EPHEDRINE SULFATE-NACL 50-0.9 MG/10ML-% IV SOSY
PREFILLED_SYRINGE | INTRAVENOUS | Status: DC | PRN
Start: 2023-01-07 — End: 2023-01-07
  Administered 2023-01-07: 15 mg via INTRAVENOUS

## 2023-01-07 MED ORDER — AMISULPRIDE (ANTIEMETIC) 5 MG/2ML IV SOLN: 10.0000 mg | Freq: Once | INTRAVENOUS | Status: DC | PRN

## 2023-01-07 MED ORDER — ACETAMINOPHEN 500 MG PO TABS
ORAL_TABLET | ORAL | Status: AC
  Filled 2023-01-07: qty 2

## 2023-01-07 MED ORDER — CEFAZOLIN SODIUM-DEXTROSE 2-4 GM/100ML-% IV SOLN
2.0000 g | INTRAVENOUS | Status: AC
  Administered 2023-01-07: 2 g via INTRAVENOUS

## 2023-01-07 MED ORDER — OXYCODONE HCL 5 MG PO TABS: 5.0000 mg | ORAL_TABLET | Freq: Once | ORAL | Status: DC | PRN

## 2023-01-07 MED ORDER — LIDOCAINE 2% (20 MG/ML) 5 ML SYRINGE
INTRAMUSCULAR | Status: DC | PRN
  Administered 2023-01-07: 60 mg via INTRAVENOUS

## 2023-01-07 MED ORDER — ONDANSETRON HCL 4 MG/2ML IJ SOLN
INTRAMUSCULAR | Status: DC | PRN
Start: 2023-01-07 — End: 2023-01-07
  Administered 2023-01-07: 4 mg via INTRAVENOUS

## 2023-01-07 MED ORDER — FENTANYL CITRATE (PF) 100 MCG/2ML IJ SOLN
INTRAMUSCULAR | Status: DC | PRN
  Administered 2023-01-07: 50 ug via INTRAVENOUS

## 2023-01-07 SURGICAL SUPPLY — 34 items
ADH SKN CLS APL DERMABOND .7 (GAUZE/BANDAGES/DRESSINGS) ×3
BLADE CLIPPER SENSICLIP SURGIC (BLADE) ×3 IMPLANT
BLADE SURG 15 STRL LF DISP TIS (BLADE) ×3 IMPLANT
BLADE SURG 15 STRL SS (BLADE) ×3
DERMABOND ADVANCED .7 DNX12 (GAUZE/BANDAGES/DRESSINGS) ×3 IMPLANT
GLOVE BIOGEL PI IND STRL 6.5 (GLOVE) ×3 IMPLANT
GLOVE BIOGEL PI IND STRL 7.0 (GLOVE) IMPLANT
GLOVE BIOGEL PI IND STRL 7.5 (GLOVE) IMPLANT
GLOVE ECLIPSE 6.0 STRL STRAW (GLOVE) ×3 IMPLANT
GLOVE SURG SS PI 7.0 STRL IVOR (GLOVE) IMPLANT
GOWN STRL REUS W/TWL LRG LVL3 (GOWN DISPOSABLE) ×3 IMPLANT
HOLDER FOLEY CATH W/STRAP (MISCELLANEOUS) ×3 IMPLANT
IV NS 1000ML (IV SOLUTION) ×3
IV NS 1000ML BAXH (IV SOLUTION) IMPLANT
KIT TURNOVER CYSTO (KITS) ×3 IMPLANT
MANIFOLD NEPTUNE II (INSTRUMENTS) ×3 IMPLANT
NDL HYPO 22X1.5 SAFETY MO (MISCELLANEOUS) ×3 IMPLANT
NEEDLE HYPO 22X1.5 SAFETY MO (MISCELLANEOUS) ×3
NS IRRIG 500ML POUR BTL (IV SOLUTION) IMPLANT
PACK CYSTO (CUSTOM PROCEDURE TRAY) ×3 IMPLANT
PACK VAGINAL WOMENS (CUSTOM PROCEDURE TRAY) ×3 IMPLANT
RETRACTOR LONE STAR DISPOSABLE (INSTRUMENTS) ×3 IMPLANT
RETRACTOR STAY HOOK 5MM (MISCELLANEOUS) ×3 IMPLANT
SCRUB CHG 4% DYNA-HEX 4OZ (MISCELLANEOUS) ×3 IMPLANT
SET IRRIG Y TYPE TUR BLADDER L (SET/KITS/TRAYS/PACK) ×3 IMPLANT
SLEEVE SCD COMPRESS KNEE MED (STOCKING) ×3 IMPLANT
SLING ADVANTAGE FIT TRANVAG (Sling) IMPLANT
SPIKE FLUID TRANSFER (MISCELLANEOUS) ×3 IMPLANT
SUCTION TUBE FRAZIER 10FR DISP (SUCTIONS) ×3 IMPLANT
SUT VIC AB 2-0 SH 27 (SUTURE) ×3
SUT VIC AB 2-0 SH 27XBRD (SUTURE) ×3 IMPLANT
SYR BULB EAR ULCER 3OZ GRN STR (SYRINGE) ×3 IMPLANT
TOWEL OR 17X24 6PK STRL BLUE (TOWEL DISPOSABLE) ×3 IMPLANT
TRAY FOLEY W/BAG SLVR 14FR LF (SET/KITS/TRAYS/PACK) ×3 IMPLANT

## 2023-01-07 NOTE — Anesthesia Procedure Notes (Addendum)
Procedure Name: LMA Insertion Date/Time: 01/07/2023 11:42 AM  Performed by: Francie Massing, CRNAPre-anesthesia Checklist: Patient identified, Emergency Drugs available, Suction available and Patient being monitored Patient Re-evaluated:Patient Re-evaluated prior to induction Oxygen Delivery Method: Circle system utilized Preoxygenation: Pre-oxygenation with 100% oxygen Induction Type: IV induction Ventilation: Mask ventilation without difficulty LMA: LMA inserted LMA Size: 4.0 Number of attempts: 1 Airway Equipment and Method: Bite block Placement Confirmation: positive ETCO2 Tube secured with: Tape Dental Injury: Teeth and Oropharynx as per pre-operative assessment

## 2023-01-07 NOTE — Interval H&P Note (Signed)
History and Physical Interval Note:  01/07/2023 11:25 AM  Cindy Decker  has presented today for surgery, with the diagnosis of stress urinary incontinence.  The various methods of treatment have been discussed with the patient and family. After consideration of risks, benefits and other options for treatment, the patient has consented to  Procedure(s): TRANSVAGINAL TAPE (TVT) PROCEDURE (N/A) CYSTOSCOPY (N/A) EXAM UNDER ANESTHESIA as a surgical intervention.  The patient's history has been reviewed, patient examined, no change in status, stable for surgery.  I have reviewed the patient's chart and labs.  Questions were answered to the patient's satisfaction.     Marguerita Beards

## 2023-01-07 NOTE — Telephone Encounter (Signed)
Cindy Decker underwent sling and cystoscopy on 01/07/23.   She failed her voiding trial.  was backfilled into the bladder She was unable to void  She was discharged with a catheter. Please call her for a routine post op check and to schedule a voiding trial by 10/3. Thanks!  Marguerita Beards, MD

## 2023-01-07 NOTE — Transfer of Care (Signed)
Immediate Anesthesia Transfer of Care Note  Patient: Cindy Decker  Procedure(s) Performed: Procedure(s) (LRB): TRANSVAGINAL TAPE (TVT) PROCEDURE (N/A) CYSTOSCOPY (N/A) EXAM UNDER ANESTHESIA  Patient Location: PACU  Anesthesia Type: General  Level of Consciousness: awake, oriented, sedated and patient cooperative  Airway & Oxygen Therapy: Patient Spontanous Breathing and Patient connected to face mask oxygen  Post-op Assessment: Report given to PACU RN and Post -op Vital signs reviewed and stable  Post vital signs: Reviewed and stable  Complications: No apparent anesthesia complications Last Vitals:  Vitals Value Taken Time  BP    Temp 36.2 C 01/07/23 1226  Pulse 69 01/07/23 1226  Resp 23 01/07/23 1226  SpO2 97 % 01/07/23 1226    Last Pain: There were no vitals filed for this visit.       Complications: No notable events documented.

## 2023-01-07 NOTE — Op Note (Signed)
Operative Note  Preoperative Diagnosis: stress urinary incontinence  Postoperative Diagnosis: same  Procedures performed:  Midurethral sling (Advantage Fit), cystoscopy  Implants:  Implant Name Type Inv. Item Serial No. Manufacturer Lot No. LRB No. Used Action  [employer] [facility] - [id number] Sling SLING ADVANTAGE FIT Regina Eck [id number] N/A 1 Implanted    Attending Surgeon: Lanetta Inch, MD  Anesthesia: General LMA  Findings: On cystoscopy, normal bladder and urethra without injury, lesion or foreign body. Brisk bilateral ureteral efflux noted.    Specimens: * No specimens in log *  Estimated blood loss: 20 mL  IV fluids: 700 mL  Urine output: 100 mL  Complications: none  Procedure in Detail:  After informed consent was obtained, the patient was taken to the operating room where she was placed under anesthesia.  She was then placed in the dorsal lithotomy position with allen stirrups,  and prepped and draped in the usual sterile fashion.  A strap was placed across her upper abdomen on top of her gown so it was not in direct contact with her skin.  Care was taken to avoid hyperflexion or hyperextension of her upper and lower extremities. A foley catheter was placed.  A lonestar self-retraining retractor was placed with 4 stay hooks. The mid urethral area was located on the anterior vaginal wall.  Two Allis clamps were placed at the level of the midurethra. 1% lidocaine with epinephrine was injected into the vaginal mucosa. A vertical incision was made between the two clamps using a 15-blade scalpel.  Using sharp dissection, Metzenbaum scissors were used to make a periurethral tunnel from the vaginal incision towards the pubic rami bilaterally for the future sling tracts. The bladder was ensured to be empty. The trocar and attached sling were introduced into the right side of the periurethral vaginal incision, just inferior to the pubic symphysis on the right  side. The trocar was guided through the endopelvic fascia and directly vertically.  While hugging the cephalad surface of the pubic bone, the trocar was guided out through the abdomen 2 fingerbreadths lateral to midline at the level of the pubic symphysis on the ipsilateral side. The trocar was placed on the left side in a similar fashion.  A 70-degree cystoscope was introduced, and 360-degree inspection revealed no trauma or trocars in the bladder, with brisk bilateral ureteral efflux.  The bladder was drained and the cystoscope was removed.  The Foley catheter was reinserted.  The sling was brought to lie beneath the mid-urethra.  A needle driver was placed behind the sling to ensure no tension.   The plastic sheath was removed from the sling and the distal ends of the sling were trimmed just below the level of the skin incisions.  Tension-free positioning of the sling was confirmed. Vaginal inspection revealed no vaginotomy or sling perforations of the mucosa.  The vaginal mucosal edges were reapproximated using 2-0 Vicryl.  The vagina was copiously irrigated.  Hemostasis was again noted. Vaginal packing not placed. The suprapubic sling incisions were closed with Dermabond. The patient tolerated the procedure well.  She was awakened from anesthesia and transferred to the recovery room in stable condition. All needle and sponge counts were correct x 2.    Marguerita Beards, MD

## 2023-01-07 NOTE — Discharge Instructions (Addendum)
POST OPERATIVE INSTRUCTIONS  General Instructions Recovery (not bed rest) will last approximately 6 weeks Walking is encouraged, but refrain from strenuous exercise/ housework/ heavy lifting for 2 weeks. No lifting >10lbs  Nothing in the vagina- NO intercourse, tampons or douching Bathing:  Do not submerge in water (NO swimming, bath, hot tub, etc) until after your postop visit. You can shower starting the day after surgery.  No driving until you are not taking narcotic pain medicine and until your pain is well enough controlled that you can slam on the breaks or make sudden movements if needed.   Taking your medications Please take your acetaminophen and ibuprofen on a schedule for the first 48 hours. Take 600mg  ibuprofen, then take 500mg  acetaminophen 3 hours later, then continue to alternate ibuprofen and acetaminophen. That way you are taking each type of medication every 6 hours. Take the prescribed narcotic (oxycodone, tramadol, etc) as needed, with a maximum being every 4 hours.  Take a stool softener daily to keep your stools soft and preventing you from straining. If you have diarrhea, you decrease your stool softener. This is explained more below. We have prescribed you Miralax.  Reasons to Call the Nurse (see last page for phone numbers) Heavy Bleeding (changing your pad every 1-2 hours) Persistent nausea/vomiting Fever (100.4 degrees or more) Incision problems (pus or other fluid coming out, redness, warmth, increased pain)  Things to Expect After Surgery Mild to Moderate pain is normal during the first day or two after surgery. If prescribed, take Ibuprofen or Tylenol first and use the stronger medicine for "break-through" pain. You can overlap these medicines because they work differently.   Constipation   To Prevent Constipation:  Eat a well-balanced diet including protein, grains, fresh fruit and vegetables.  Drink plenty of fluids. Walk regularly.  Depending on specific  instructions from your physician: take Miralax daily and additionally you can add a stool softener (colace/ docusate) and fiber supplement. Continue as long as you're on pain medications.   To Treat Constipation:  If you do not have a bowel movement in 2 days after surgery, you can take 2 Tbs of Milk of Magnesia 1-2 times a day until you have a bowel movement. If diarrhea occurs, decrease the amount or stop the laxative. If no results with Milk of Magnesia, you can drink a bottle of magnesium citrate which you can purchase over the counter.  Fatigue:  This is a normal response to surgery and will improve with time.  Plan frequent rest periods throughout the day.  Gas Pain:  This is very common but can also be very painful! Drink warm liquids such as herbal teas, bouillon or soup. Walking will help you pass more gas.  Mylicon or Gas-X can be taken over the counter.  Leaking Urine:  Varying amounts of leakage may occur after surgery.  This should improve with time. Your bladder needs at least 3 months to recover from surgery. If you leak after surgery, be sure to mention this to your doctor at your post-op visit. If you were taking medications for overactive bladder prior to surgery, be sure to restart the medications immediately after surgery.  Incisions: If you have incisions on your abdomen, the skin glue will dissolve on its own over time. It is ok to gently rinse with soap and water over these incisions but do not scrub.  Catheter Approximately 50% of patients are unable to urinate after surgery and need to go home with a catheter. This allows  your bladder to rest so it can return to full function. If you go home with a catheter, the office will call to set up a voiding trial a few days after surgery. For most patients, by this visit, they are able to urinate on their own. Long term catheter use is rare.   Return to Work  As work demands and recovery times vary widely, it is hard to predict when  you will want to return to work. If you have a desk job with no strenuous physical activity, and if you would like to return sooner than generally recommended, discuss this with your provider or call our office.   Post op concerns  For non-emergent issues, please call the Urogynecology Nurse. Please leave a message and someone will contact you within one business day.  You can also send a message through MyChart.   AFTER HOURS (After 5:00 PM and on weekends):  For urgent matters that cannot wait until the next business day. Call our office [phone] and connect to the doctor on call.  Please reserve this for important issues.   **FOR ANY TRUE EMERGENCY ISSUES CALL 911 OR GO TO THE NEAREST EMERGENCY ROOM.** Please inform our office or the doctor on call of any emergency.     APPOINTMENTS: Call [phone]  Post Anesthesia Home Care Instructions  Activity: Get plenty of rest for the remainder of the day. A responsible individual must stay with you for 24 hours following the procedure.  For the next 24 hours, DO NOT: -Drive a car -Advertising copywriter -Drink alcoholic beverages -Take any medication unless instructed by your physician -Make any legal decisions or sign important papers.  Meals: Start with liquid foods such as gelatin or soup. Progress to regular foods as tolerated. Avoid greasy, spicy, heavy foods. If nausea and/or vomiting occur, drink only clear liquids until the nausea and/or vomiting subsides. Call your physician if vomiting continues.  Special Instructions/Symptoms: Your throat may feel dry or sore from the anesthesia or the breathing tube placed in your throat during surgery. If this causes discomfort, gargle with warm salt water. The discomfort should disappear within 24 hours.  No acetaminophen/Tylenol until after 3:30 pm today if needed. No ibuprofen, Advil, Aleve, Motrin, ketorolac, meloxicam, naproxen, or other NSAIDS until after 6:15 pm today if needed.

## 2023-01-07 NOTE — Anesthesia Postprocedure Evaluation (Signed)
Anesthesia Post Note  Patient: Cindy Decker  Procedure(s) Performed: TRANSVAGINAL TAPE (TVT) PROCEDURE (Vagina ) CYSTOSCOPY (Urethra) EXAM UNDER ANESTHESIA (Vagina )     Patient location during evaluation: PACU Anesthesia Type: General Level of consciousness: awake Pain management: pain level controlled Vital Signs Assessment: post-procedure vital signs reviewed and stable Respiratory status: spontaneous breathing, nonlabored ventilation and respiratory function stable Cardiovascular status: blood pressure returned to baseline and stable Postop Assessment: no apparent nausea or vomiting Anesthetic complications: no   No notable events documented.  Last Vitals:  Vitals:   01/07/23 1245 01/07/23 1420  BP: (!) 110/53 119/77  Pulse: 63 (!) 57  Resp: 20 16  Temp:  36.6 C  SpO2: 93% 97%    Last Pain:  Vitals:   01/07/23 1420  TempSrc: Oral  PainSc: 0-No pain                 Festus Pursel P Marshon Bangs

## 2023-01-07 NOTE — Anesthesia Preprocedure Evaluation (Addendum)
Anesthesia Evaluation  Patient identified by MRN, date of birth, ID band Patient awake    Reviewed: Allergy & Precautions, NPO status , Patient's Chart, lab work & pertinent test results  Airway Mallampati: II  TM Distance: >3 FB Neck ROM: Full    Dental no notable dental hx.    Pulmonary asthma , former smoker   Pulmonary exam normal        Cardiovascular negative cardio ROS Normal cardiovascular exam     Neuro/Psych negative neurological ROS  negative psych ROS   GI/Hepatic negative GI ROS, Neg liver ROS,,,  Endo/Other  negative endocrine ROS    Renal/GU negative Renal ROS     Musculoskeletal  (+) Arthritis ,    Abdominal   Peds  Hematology negative hematology ROS (+)   Anesthesia Other Findings stress urinary incontinence  Reproductive/Obstetrics                             Anesthesia Physical Anesthesia Plan  ASA: 2  Anesthesia Plan: General   Post-op Pain Management:    Induction: Intravenous  PONV Risk Score and Plan: 3 and Ondansetron, Dexamethasone, Midazolam and Treatment may vary due to age or medical condition  Airway Management Planned: LMA  Additional Equipment:   Intra-op Plan:   Post-operative Plan: Extubation in OR  Informed Consent: I have reviewed the patients History and Physical, chart, labs and discussed the procedure including the risks, benefits and alternatives for the proposed anesthesia with the patient or authorized representative who has indicated his/her understanding and acceptance.     Dental advisory given  Plan Discussed with: CRNA  Anesthesia Plan Comments:        Anesthesia Quick Evaluation

## 2023-01-09 ENCOUNTER — Encounter (HOSPITAL_BASED_OUTPATIENT_CLINIC_OR_DEPARTMENT_OTHER): Payer: Self-pay | Admitting: Obstetrics and Gynecology

## 2023-01-10 ENCOUNTER — Ambulatory Visit (INDEPENDENT_AMBULATORY_CARE_PROVIDER_SITE_OTHER): Payer: BC Managed Care – PPO | Admitting: Obstetrics and Gynecology

## 2023-01-10 VITALS — BP 111/75 | HR 62

## 2023-01-10 DIAGNOSIS — Z09 Encounter for follow-up examination after completed treatment for conditions other than malignant neoplasm: Secondary | ICD-10-CM

## 2023-01-10 DIAGNOSIS — Z48816 Encounter for surgical aftercare following surgery on the genitourinary system: Secondary | ICD-10-CM

## 2023-01-10 NOTE — Telephone Encounter (Signed)
Saw in office for voiding trial today

## 2023-01-10 NOTE — Progress Notes (Signed)
[facility] Return Visit  SUBJECTIVE  History of Present Illness: Cindy Decker is a 62 y.o. female seen in follow-up for  Exam under anesthesia, Midurethral Sling with Cystoscopy on 01/07/23. She went home with a catheter and presents today for voiding trial.    Pain 1/10 and is controlled with tylenol and ibuprofen  Patient has had a bowel movement.  Has been seeing small amounts of blood but reports nothing concerning.   Past Medical History: Patient  has a past medical history of ALLERGIC RHINITIS, Asthma, mild persistent, GERD (gastroesophageal reflux disease), Interstitial cystitis, Iron deficiency, OA (osteoarthritis), SUI (stress urinary incontinence, female), and Wears glasses.   Past Surgical History: She  has a past surgical history that includes Cystoscopy with hydrodistension and biopsy; Colonoscopy with propofol (10/2022); Posterior fusion cervical spine (1984); Bladder suspension (N/A, 01/07/2023); and Cystoscopy (N/A, 01/07/2023).   Medications: She has a current medication list which includes the following prescription(s): acetaminophen, albuterol, alendronate, vitamin c, calcium carbonate, calcium carbonate-vitamin d, cetirizine, vitamin d3, ferrous sulfate, fluticasone-salmeterol, ibuprofen, montelukast, polyethylene glycol powder, and spacer/aero-hold chamber bags.   Allergies: Patient is allergic to nitrofurantoin.   Social History: Patient  reports that she quit smoking about 36 years ago. Her smoking use included cigarettes. She started smoking about 47 years ago. She has a 11 pack-year smoking history. She has never used smokeless tobacco. She reports current alcohol use. She reports that she does not use drugs.      OBJECTIVE   Patient's bladder was backfilled with 250 mL and she reported cramping.  The catheter was removed and she was allowed to void on her own.  That was placed in the toilet and she was shown to have urinated 300 mL.  Physical  Exam: Vitals:   01/10/23 1102  BP: 111/75  Pulse: 62   Gen: No apparent distress, A&O x 3.  Detailed Urogynecologic Evaluation:  Deferred.    ASSESSMENT AND PLAN    Cindy Decker is a 62 y.o. with:  1. Postop check    Patient passed voiding trial today. Overall patient believes she is doing very well she endorses that she will let us know if she begins to have any problems. Patient to return for routine 6-week postop check

## 2023-01-12 ENCOUNTER — Telehealth: Payer: Self-pay | Admitting: Obstetrics and Gynecology

## 2023-01-12 DIAGNOSIS — R339 Retention of urine, unspecified: Secondary | ICD-10-CM | POA: Diagnosis not present

## 2023-01-12 DIAGNOSIS — R102 Pelvic and perineal pain: Secondary | ICD-10-CM | POA: Diagnosis not present

## 2023-01-12 NOTE — Telephone Encounter (Signed)
Call from patient: unable to void  Recent TVT with Dr Florian Buff. Catheter removal in the office 01/10/23 Has been up all night with severe bladder spasm and unable to empty her bladder.  Was able to void some yesterday but has progressively worsened.  I recommend reinsertion of Foley to rest bladder with another voiding trial in the office. Patient declines going to [facility] or [facility] ED with expected long wait. She will be taken to [facility] ED by her daughter-in-law who is a Charity fundraiser there and have Foley placed.  Will inform her provider for follow-up 01/14/23

## 2023-01-16 ENCOUNTER — Ambulatory Visit (INDEPENDENT_AMBULATORY_CARE_PROVIDER_SITE_OTHER): Payer: BC Managed Care – PPO

## 2023-01-16 DIAGNOSIS — Z48816 Encounter for surgical aftercare following surgery on the genitourinary system: Secondary | ICD-10-CM

## 2023-01-16 NOTE — Progress Notes (Signed)
Cindy Decker is a 62 y.o. female  unable to void after procedure.  14 fr cath removed with no complications. Pt shown how to change from leg drainage bag to overnight drainage bag.  Pt was scheduled for a nurse visit appt on Monday 01-21-2023 @ 9:00am  PVR: 195 Bladder drained with 60fr cath: 150cc

## 2023-01-16 NOTE — Patient Instructions (Signed)
Keep all post op appointment

## 2023-01-16 NOTE — Progress Notes (Signed)
Urogyn Nurse Voiding Trial Note  [name] underwent TRANSVAGINAL TAPE (TVT) PROCEDURE (Vagina )  CYSTOSCOPY (Urethra)  EXAM UNDER ANESTHESIA  on 9/30-24.  She presents today for a voiding trial .   Patient was identified with 2 indicators. of NS was instilled into the bladder via the catheter. The catheter was removed and patient was instructed to void into the urinary hat. She voided . The post void residual measured by bladder scan was . She failed the voiding trial and a catheter was replaced.   The patient received aftercare instructions and will follow up as scheduled.

## 2023-01-21 ENCOUNTER — Ambulatory Visit (INDEPENDENT_AMBULATORY_CARE_PROVIDER_SITE_OTHER): Payer: BC Managed Care – PPO

## 2023-01-21 DIAGNOSIS — Z48816 Encounter for surgical aftercare following surgery on the genitourinary system: Secondary | ICD-10-CM

## 2023-01-22 NOTE — Patient Instructions (Signed)
Keep all post op appointments

## 2023-01-22 NOTE — Progress Notes (Signed)
Cindy Decker is a 62 y.o. female here for a voiding trial.  14 fr cath removed with no complications.  Instilled: 290 Voided: 200 PVR: 63

## 2023-02-15 ENCOUNTER — Other Ambulatory Visit: Payer: Self-pay | Admitting: Internal Medicine

## 2023-02-15 DIAGNOSIS — Z23 Encounter for immunization: Secondary | ICD-10-CM | POA: Diagnosis not present

## 2023-02-15 DIAGNOSIS — M810 Age-related osteoporosis without current pathological fracture: Secondary | ICD-10-CM | POA: Diagnosis not present

## 2023-02-15 DIAGNOSIS — Z0000 Encounter for general adult medical examination without abnormal findings: Secondary | ICD-10-CM | POA: Diagnosis not present

## 2023-02-15 DIAGNOSIS — Z13820 Encounter for screening for osteoporosis: Secondary | ICD-10-CM

## 2023-02-15 DIAGNOSIS — E559 Vitamin D deficiency, unspecified: Secondary | ICD-10-CM | POA: Diagnosis not present

## 2023-02-15 DIAGNOSIS — Z13220 Encounter for screening for lipoid disorders: Secondary | ICD-10-CM | POA: Diagnosis not present

## 2023-02-18 ENCOUNTER — Encounter: Payer: BC Managed Care – PPO | Admitting: Obstetrics and Gynecology

## 2023-02-20 ENCOUNTER — Ambulatory Visit (INDEPENDENT_AMBULATORY_CARE_PROVIDER_SITE_OTHER): Payer: BC Managed Care – PPO | Admitting: Obstetrics and Gynecology

## 2023-02-20 ENCOUNTER — Encounter: Payer: Self-pay | Admitting: Obstetrics and Gynecology

## 2023-02-20 VITALS — BP 110/68 | HR 51

## 2023-02-20 DIAGNOSIS — Z48816 Encounter for surgical aftercare following surgery on the genitourinary system: Secondary | ICD-10-CM

## 2023-02-20 DIAGNOSIS — Z9889 Other specified postprocedural states: Secondary | ICD-10-CM

## 2023-02-20 NOTE — Progress Notes (Signed)
[facility]  Date of Visit: 02/20/2023  History of Present Illness: Ms. Coglianese is a 62 y.o. female scheduled today for a post-operative visit.   Surgery: s/p Midurethral sling ([employer]), cystoscopy on 01/07/23  She did not pass her postoperative void trial. Returned to office 10/3 for voiding trial and passed  Postoperative course has been uncomplicated.   Today she reports has been coughing and has not had any leakage.   UTI in the last 6 weeks? No  Pain?  Slight pain on the right side She has returned to her normal activity (except for postop restrictions) Vaginal bulge?  N/a Stress incontinence: No  Urgency/frequency:  occasional Urge incontinence: Yes - some in the first few weeks Voiding dysfunction: No - had some difficulty at night initially when she was more full but that has improved Bowel issues: No    Medications: She has a current medication list which includes the following prescription(s): albuterol, alendronate, vitamin c, calcium carbonate, calcium carbonate-vitamin d, cetirizine, vitamin d3, ferrous sulfate, fluticasone-salmeterol, montelukast, spacer/aero-hold chamber bags, and acetaminophen.   Allergies: Patient is allergic to nitrofurantoin.   Physical Exam: BP 110/68   Pulse (!) 51    Suprapubic Incisions: healing well.  Pelvic Examination: Vagina: Incisions healing well. Sutures are not present at incision line and there is not granulation tissue. No visible or palpable mesh.   ---------------------------------------------------------  Assessment and Plan:  1. Post-operative state     - Well healed.  - Can resume regular activity including exercise and intercourse,  if desired.  - Can address bladder urgency if it worsens. She does not feel it is bad enough for treatment at this time.   Marguerita Beards, MD

## 2023-03-20 DIAGNOSIS — K644 Residual hemorrhoidal skin tags: Secondary | ICD-10-CM | POA: Diagnosis not present

## 2023-03-20 DIAGNOSIS — K648 Other hemorrhoids: Secondary | ICD-10-CM | POA: Diagnosis not present

## 2023-04-18 ENCOUNTER — Other Ambulatory Visit (HOSPITAL_COMMUNITY): Payer: Self-pay | Admitting: Internal Medicine

## 2023-04-18 DIAGNOSIS — R1011 Right upper quadrant pain: Secondary | ICD-10-CM

## 2023-04-19 ENCOUNTER — Ambulatory Visit (HOSPITAL_COMMUNITY): Payer: BC Managed Care – PPO

## 2023-04-22 ENCOUNTER — Ambulatory Visit (HOSPITAL_COMMUNITY)
Admission: RE | Admit: 2023-04-22 | Discharge: 2023-04-22 | Disposition: A | Discharge status: Home / Self Care | Payer: BC Managed Care – PPO | Source: Ambulatory Visit | Attending: Internal Medicine | Admitting: Internal Medicine

## 2023-04-22 DIAGNOSIS — R1011 Right upper quadrant pain: Secondary | ICD-10-CM | POA: Insufficient documentation

## 2023-05-20 ENCOUNTER — Other Ambulatory Visit: Payer: Self-pay | Admitting: Internal Medicine

## 2023-05-20 DIAGNOSIS — R1011 Right upper quadrant pain: Secondary | ICD-10-CM

## 2023-05-23 ENCOUNTER — Ambulatory Visit
Admission: RE | Admit: 2023-05-23 | Discharge: 2023-05-23 | Disposition: A | Discharge status: Home / Self Care | Payer: BC Managed Care – PPO | Source: Ambulatory Visit | Attending: Internal Medicine | Admitting: Internal Medicine

## 2023-05-23 DIAGNOSIS — I700 Atherosclerosis of aorta: Secondary | ICD-10-CM | POA: Diagnosis not present

## 2023-05-23 DIAGNOSIS — R1011 Right upper quadrant pain: Secondary | ICD-10-CM

## 2023-05-23 MED ORDER — IOPAMIDOL (ISOVUE-300) INJECTION 61%
80.0000 mL | Freq: Once | INTRAVENOUS | Status: AC | PRN
  Administered 2023-05-23: 80 mL via INTRAVENOUS

## 2023-07-05 DIAGNOSIS — M545 Low back pain, unspecified: Secondary | ICD-10-CM | POA: Diagnosis not present

## 2023-07-17 DIAGNOSIS — M47816 Spondylosis without myelopathy or radiculopathy, lumbar region: Secondary | ICD-10-CM | POA: Diagnosis not present

## 2023-08-02 DIAGNOSIS — M47816 Spondylosis without myelopathy or radiculopathy, lumbar region: Secondary | ICD-10-CM | POA: Diagnosis not present

## 2023-08-09 DIAGNOSIS — M47816 Spondylosis without myelopathy or radiculopathy, lumbar region: Secondary | ICD-10-CM | POA: Diagnosis not present

## 2023-08-27 DIAGNOSIS — R918 Other nonspecific abnormal finding of lung field: Secondary | ICD-10-CM | POA: Diagnosis not present

## 2023-10-15 DIAGNOSIS — H2513 Age-related nuclear cataract, bilateral: Secondary | ICD-10-CM | POA: Diagnosis not present

## 2023-10-15 DIAGNOSIS — H43812 Vitreous degeneration, left eye: Secondary | ICD-10-CM | POA: Diagnosis not present

## 2023-10-15 DIAGNOSIS — H40033 Anatomical narrow angle, bilateral: Secondary | ICD-10-CM | POA: Diagnosis not present

## 2023-10-15 DIAGNOSIS — H43813 Vitreous degeneration, bilateral: Secondary | ICD-10-CM | POA: Diagnosis not present

## 2023-10-15 DIAGNOSIS — H43811 Vitreous degeneration, right eye: Secondary | ICD-10-CM | POA: Diagnosis not present

## 2023-10-17 ENCOUNTER — Ambulatory Visit (HOSPITAL_BASED_OUTPATIENT_CLINIC_OR_DEPARTMENT_OTHER)
Admission: RE | Admit: 2023-10-17 | Discharge: 2023-10-17 | Disposition: A | Discharge status: Home / Self Care | Source: Ambulatory Visit | Attending: Internal Medicine | Admitting: Internal Medicine

## 2023-10-17 DIAGNOSIS — Z780 Asymptomatic menopausal state: Secondary | ICD-10-CM | POA: Diagnosis not present

## 2023-10-17 DIAGNOSIS — M810 Age-related osteoporosis without current pathological fracture: Secondary | ICD-10-CM | POA: Diagnosis not present

## 2023-10-17 DIAGNOSIS — Z13820 Encounter for screening for osteoporosis: Secondary | ICD-10-CM | POA: Insufficient documentation

## 2023-10-18 ENCOUNTER — Other Ambulatory Visit: Payer: BC Managed Care – PPO

## 2023-10-21 DIAGNOSIS — M810 Age-related osteoporosis without current pathological fracture: Secondary | ICD-10-CM | POA: Diagnosis not present

## 2023-11-04 DIAGNOSIS — Z01419 Encounter for gynecological examination (general) (routine) without abnormal findings: Secondary | ICD-10-CM | POA: Diagnosis not present

## 2023-11-04 DIAGNOSIS — Z124 Encounter for screening for malignant neoplasm of cervix: Secondary | ICD-10-CM | POA: Diagnosis not present

## 2023-11-15 DIAGNOSIS — M810 Age-related osteoporosis without current pathological fracture: Secondary | ICD-10-CM | POA: Diagnosis not present

## 2023-12-10 DIAGNOSIS — L570 Actinic keratosis: Secondary | ICD-10-CM | POA: Diagnosis not present

## 2023-12-10 DIAGNOSIS — L820 Inflamed seborrheic keratosis: Secondary | ICD-10-CM | POA: Diagnosis not present

## 2023-12-10 DIAGNOSIS — D225 Melanocytic nevi of trunk: Secondary | ICD-10-CM | POA: Diagnosis not present

## 2023-12-10 DIAGNOSIS — X32XXXD Exposure to sunlight, subsequent encounter: Secondary | ICD-10-CM | POA: Diagnosis not present

## 2023-12-10 DIAGNOSIS — L821 Other seborrheic keratosis: Secondary | ICD-10-CM | POA: Diagnosis not present

## 2023-12-13 DIAGNOSIS — Z1231 Encounter for screening mammogram for malignant neoplasm of breast: Secondary | ICD-10-CM | POA: Diagnosis not present

## 2024-02-21 DIAGNOSIS — M810 Age-related osteoporosis without current pathological fracture: Secondary | ICD-10-CM | POA: Diagnosis not present

## 2024-02-21 DIAGNOSIS — Z0000 Encounter for general adult medical examination without abnormal findings: Secondary | ICD-10-CM | POA: Diagnosis not present

## 2024-02-21 DIAGNOSIS — Z23 Encounter for immunization: Secondary | ICD-10-CM | POA: Diagnosis not present

## 2024-04-01 DIAGNOSIS — R0981 Nasal congestion: Secondary | ICD-10-CM | POA: Diagnosis not present

## 2024-04-01 DIAGNOSIS — R051 Acute cough: Secondary | ICD-10-CM | POA: Diagnosis not present

## 2024-04-01 DIAGNOSIS — J209 Acute bronchitis, unspecified: Secondary | ICD-10-CM | POA: Diagnosis not present

## 2024-04-01 DIAGNOSIS — R509 Fever, unspecified: Secondary | ICD-10-CM | POA: Diagnosis not present
